# Patient Record
Sex: Female | Born: 1965
Health system: Southern US, Community
[De-identification: ages and names within clinical notes are randomized; demographics above are authoritative.]

## PROBLEM LIST (undated history)

## (undated) DIAGNOSIS — D649 Anemia, unspecified: Secondary | ICD-10-CM

## (undated) DIAGNOSIS — R51 Headache: Secondary | ICD-10-CM

## (undated) DIAGNOSIS — M797 Fibromyalgia: Secondary | ICD-10-CM

## (undated) DIAGNOSIS — F329 Major depressive disorder, single episode, unspecified: Secondary | ICD-10-CM

## (undated) DIAGNOSIS — F32A Depression, unspecified: Secondary | ICD-10-CM

## (undated) DIAGNOSIS — F419 Anxiety disorder, unspecified: Secondary | ICD-10-CM

## (undated) DIAGNOSIS — Z860100 Personal history of colon polyps, unspecified: Secondary | ICD-10-CM

## (undated) DIAGNOSIS — T7840XA Allergy, unspecified, initial encounter: Secondary | ICD-10-CM

## (undated) DIAGNOSIS — E079 Disorder of thyroid, unspecified: Secondary | ICD-10-CM

## (undated) DIAGNOSIS — G4700 Insomnia, unspecified: Secondary | ICD-10-CM

## (undated) DIAGNOSIS — R569 Unspecified convulsions: Secondary | ICD-10-CM

## (undated) HISTORY — PX: ABDOMINAL HYSTERECTOMY: SHX81

## (undated) HISTORY — DX: Fibromyalgia: M79.7

## (undated) HISTORY — DX: Headache: R51

## (undated) HISTORY — DX: Anxiety disorder, unspecified: F41.9

## (undated) HISTORY — DX: Insomnia, unspecified: G47.00

## (undated) HISTORY — DX: Personal history of colon polyps, unspecified: Z86.0100

## (undated) HISTORY — DX: Allergy, unspecified, initial encounter: T78.40XA

## (undated) HISTORY — DX: Unspecified convulsions: R56.9

## (undated) HISTORY — DX: Major depressive disorder, single episode, unspecified: F32.9

## (undated) HISTORY — DX: Disorder of thyroid, unspecified: E07.9

## (undated) HISTORY — DX: Anemia, unspecified: D64.9

## (undated) HISTORY — PX: KNEE ARTHROSCOPY: SUR90

## (undated) HISTORY — DX: Personal history of colonic polyps: Z86.010

## (undated) HISTORY — DX: Depression, unspecified: F32.A

---

## 2001-02-05 ENCOUNTER — Other Ambulatory Visit: Admission: RE | Admit: 2001-02-05 | Discharge: 2001-02-05 | Payer: Self-pay | Admitting: Obstetrics and Gynecology

## 2001-02-08 ENCOUNTER — Ambulatory Visit (HOSPITAL_COMMUNITY): Admission: RE | Admit: 2001-02-08 | Discharge: 2001-02-08 | Payer: Self-pay | Admitting: Obstetrics and Gynecology

## 2001-05-21 ENCOUNTER — Inpatient Hospital Stay (HOSPITAL_COMMUNITY): Admission: AD | Admit: 2001-05-21 | Discharge: 2001-05-23 | Payer: Self-pay | Admitting: Obstetrics and Gynecology

## 2002-06-07 ENCOUNTER — Emergency Department (HOSPITAL_COMMUNITY): Admission: EM | Admit: 2002-06-07 | Discharge: 2002-06-07 | Payer: Self-pay | Admitting: Emergency Medicine

## 2002-06-07 ENCOUNTER — Encounter: Payer: Self-pay | Admitting: Emergency Medicine

## 2004-04-06 ENCOUNTER — Ambulatory Visit (HOSPITAL_COMMUNITY): Admission: RE | Admit: 2004-04-06 | Discharge: 2004-04-06 | Payer: Self-pay | Admitting: Family Medicine

## 2004-06-30 ENCOUNTER — Ambulatory Visit (HOSPITAL_COMMUNITY): Admission: RE | Admit: 2004-06-30 | Discharge: 2004-06-30 | Payer: Self-pay | Admitting: Family Medicine

## 2004-10-26 ENCOUNTER — Encounter: Admission: RE | Admit: 2004-10-26 | Discharge: 2004-10-26 | Payer: Self-pay | Admitting: Orthopedic Surgery

## 2005-09-13 ENCOUNTER — Encounter: Admission: RE | Admit: 2005-09-13 | Discharge: 2005-09-13 | Payer: Self-pay | Admitting: Neurological Surgery

## 2007-02-25 ENCOUNTER — Encounter: Admission: RE | Admit: 2007-02-25 | Discharge: 2007-02-25 | Payer: Self-pay | Admitting: Neurology

## 2008-02-05 ENCOUNTER — Encounter: Admission: RE | Admit: 2008-02-05 | Discharge: 2008-02-05 | Payer: Self-pay | Admitting: Orthopedic Surgery

## 2009-08-17 ENCOUNTER — Ambulatory Visit (HOSPITAL_COMMUNITY): Admission: RE | Admit: 2009-08-17 | Discharge: 2009-08-17 | Payer: Self-pay | Admitting: Family Medicine

## 2010-11-13 ENCOUNTER — Ambulatory Visit (HOSPITAL_COMMUNITY)
Admission: RE | Admit: 2010-11-13 | Discharge: 2010-11-13 | Disposition: A | Discharge status: Home / Self Care | Payer: Self-pay | Source: Ambulatory Visit | Attending: Family Medicine | Admitting: Family Medicine

## 2010-11-13 ENCOUNTER — Other Ambulatory Visit: Payer: Self-pay | Admitting: Family Medicine

## 2010-11-13 DIAGNOSIS — X58XXXA Exposure to other specified factors, initial encounter: Secondary | ICD-10-CM | POA: Insufficient documentation

## 2010-11-13 DIAGNOSIS — S0003XA Contusion of scalp, initial encounter: Secondary | ICD-10-CM | POA: Insufficient documentation

## 2010-11-13 DIAGNOSIS — J3489 Other specified disorders of nose and nasal sinuses: Secondary | ICD-10-CM | POA: Insufficient documentation

## 2010-11-13 DIAGNOSIS — R569 Unspecified convulsions: Secondary | ICD-10-CM | POA: Insufficient documentation

## 2010-11-13 DIAGNOSIS — R51 Headache: Secondary | ICD-10-CM | POA: Insufficient documentation

## 2010-11-26 NOTE — Discharge Summary (Signed)
[facility]  Patient:    Courtney Green, Courtney Green Visit Number: [id number] MRN: [id number]          Service Type: SUR Location: 4A A418 01 Attending Physician:  Tilda Burrow Dictated by:   Christin Bach, M.D. Admit Date:  05/21/2001 Discharge Date: 05/23/2001                             Discharge Summary  ADMISSION DIAGNOSES: 1. Chronic pelvic pain. 2. Dysmenorrhea.  DISCHARGE DIAGNOSES: 1. Chronic pelvic pain. 2. Dysmenorrhea.  PROCEDURES:  Laparoscopic-assisted vaginal hysterectomy with bilateral salpingo-oophorectomy.  DISCHARGE MEDICATIONS: 1. Tylox 1 q.4h. p.r.n. pain, dispense 15 tablets. 2. Motrin 200 mg 2 q.4h. p.r.n. mild pain. 3. Doxycycline 100 mg twice daily for one week. 4. Climara 0.1 mg patch to skin weekly.  FOLLOWUP:  In one week in my office.  HISTORY OF PRESENT ILLNESS:  This 45 year old female, with a longstanding history of chronic pelvic pain and dysmenorrhea, status post diagnostic laparoscopy which did not reveal any readily correctable etiology, was admitted for an abdominal hysterectomy after failed medical therapy.  The patient wanted removal of the ovaries after discussion.  The patient has had debilitating adnexal pain.  She has been fully informed of potential for osteoporosis and long-term ______ on hormone replacement therapy.  She understands that estrogen therapy of some sort is appropriate at this time and osteoporosis therapy long term requirement.  PAST MEDICAL HISTORY:  Positive for cigarette use.  PAST SURGICAL HISTORY:  Diagnostic laparoscopy in 2002, tubal ligation in 1993, arthroscopy x 2 in 1987 and 1990.  ALLERGIES:  CODEINE.  MEDICATIONS:  None.  SOCIAL HISTORY:  Cigarettes: Greater than one pack per day.  PHYSICAL EXAMINATION:  VITAL SIGNS:  Height 5 feet 5 inches, weight 160.  Blood pressure 112/80, pulse 84, respirations 16.  HEENT:  Pupils equal, round, and reactive.  Extraocular  movements intact.  NECK:  Supple.  Trachea midline.  CHEST:  Clear to auscultation.  ABDOMEN:  Nontender.  PELVIC:  Vaginal exam shows first degree uterine decensus with uterus severely sensitive to contact, leaving patient crampy for hours after exam or intercourse.  Adnexa without masses.  Left lower quadrant 2+ tenderness upon palpation of the abdomen.  HOSPITAL COURSE:  The patient was admitted, underwent laparoscopic-assisted vaginal hysterectomy with 200 cc blood loss.  Postoperatively, the patient had a hemoglobin of 11, hematocrit 32, with nontender abdomen.  Bowel sounds returned nicely.  The patient had passage of gas and resumption of tolerance of a regular diet and was discharged home on postop day #2 for followup in one week in our office. Dictated by:   Christin Bach, M.D. Attending Physician:  Tilda Burrow DD:  06/14/01 TD:  06/14/01 Job: [id number]

## 2010-11-26 NOTE — Op Note (Signed)
[facility]  Patient:    Courtney Green, Courtney Green Visit Number: [id number] MRN: [id number]          Service Type: SUR Location: 4A A418 01 Attending Physician:  Tilda Burrow Dictated by:   Christin Bach, M.D. Proc. Date: 05/21/01 Admit Date:  05/21/2001 Discharge Date: 05/23/2001                             Operative Report  PREOPERATIVE DIAGNOSIS:  Chronic pelvic pain, rule out adenomyosis.  POSTOPERATIVE DIAGNOSIS:  Chronic pelvic pain, rule out adenomyosis.  OPERATION: 1. Laparoscopic-assisted vaginal hysterectomy. 2. Bilateral salpingo-oophorectomy.  SURGEON:  Christin Bach, M.D.  ASSISTANT:  None.  ANESTHESIA:  General.  COMPLICATIONS:  Need to delay procedure after laparoscopic observation portion of the procedure completed due to emergency cesarean section elsewhere.  DETAILS OF PROCEDURE:  The patient was taken to the operating room and prepped and draped in the usual fashion for combined abdominal and vaginal procedure with Foley catheter in place, abdomen and perineum prepped and draped, and the legs supported in low lithotomy yellow fin supports.  The Foley catheter was in place as mentioned, and the anterior incision was made on the anterior cervicovaginal fornix, the bladder elevated up.  A gauze was placed in the dissected cleavage plane which had been anesthetized with Xylocaine with epinephrine which resulted in improved hemostasis.  Attention was then directed to the abdomen where subumbilical vertical 1 cm skin incision was made as well as a transverse suprapubic 1 cm incision.  The Veress needle was introduced into the umbilicus, and 4 mmHg intra-abdominal pressure confirmed.  The procedure then had the 10 mm umbilical port inserted without difficulty and the suprapubic port inserted under direct visualization.  At this time, it became necessary to delay proceeding any further due to an emergency in labor and delivery  which required brief assessment before deciding for emergency cesarean which was handled elsewhere by Dr. Lisette Grinder and dictated by him.  Once the baby was delivered and the uterine incision closed, we then proceeded once again to start with the LAVH.  The abdomen was re-insufflated, the suprapubic trocars used to manipulate bowel which was found to be hemostatic. The lower quadrant lateral incisions were made to allow introduction of an 11 mm multiport on each side, with attention then directed to the left adnexa where the round ligament was easily visible.  The left adnexa was without adhesions, and the thickened capsule mentioned at last exam was the only notable finding.  As per patient request, we planned to remove both tubes and ovaries bilaterally.  First, the ureters were identified beneath the retroperitoneal surfaces and were confirmed to be well out of harms way.  The triangle behind the round ligament was then opened, and the loose areolar connective tissue dissected free.  The uterus was brought for plans to remove the three major samples.  We then proceeded to take the round ligaments down bilaterally and used retroperitoneal dissection to isolate the infundibulopelvic ligament on each side.  The infundibulopelvic ligament was carefully isolated and crossclamped with Endo GIA 3.5 staples which were easily fired and achieved hemostasis. The adnexal structures were skeletonized down to the uterine vessels.  The bladder flap was opened anteriorly down to the loose gauze placed per vagina earlier in the case.  Vaginal hysterectomy then proceeded in the standard fashion after repositioning myself to the end of the table, sitting down, placing the lap  tray on the abdomen, and beginning the hysterectomy.  The posterior colpotomy incision was performed, and then the uterosacral ligaments were taken down on each side.  We then proceeded to move up the lateral cardinal ligaments  on each side, clamping, cutting, and suture ligating in series.  There was approximately 200 cc of bleeding which was mainly related to the cul-de-sac edge which bled more generously.  Once the procedure was completed, we then proceeded to place a 2-0 silk suture between the uterosacral ligaments on either side, pulling them together in the midline, and then the cuff was closed using pursestring suture of 2-0 chromic.  The cuff was closed by pulling the uterosacral ligaments together in the midline, tying them together, tying the silk suture in the midline as well, and then midline closure of the vaginal cuff with good tissue support results.  The patient tolerated the procedure well and went to the recovery room in good condition. ictated by:   Christin Bach, M.D. Attending Physician:  Tilda Burrow DD:  05/21/01 TD:  05/21/01 Job: [id number]

## 2011-06-10 ENCOUNTER — Ambulatory Visit
Admission: RE | Admit: 2011-06-10 | Discharge: 2011-06-10 | Disposition: A | Discharge status: Home / Self Care | Payer: No Typology Code available for payment source | Source: Ambulatory Visit | Attending: Infectious Diseases | Admitting: Infectious Diseases

## 2011-06-10 ENCOUNTER — Other Ambulatory Visit: Payer: Self-pay | Admitting: Infectious Diseases

## 2011-06-10 DIAGNOSIS — R7611 Nonspecific reaction to tuberculin skin test without active tuberculosis: Secondary | ICD-10-CM

## 2011-10-31 ENCOUNTER — Other Ambulatory Visit: Payer: Self-pay | Admitting: Family Medicine

## 2011-11-01 ENCOUNTER — Other Ambulatory Visit: Payer: Self-pay

## 2011-11-02 ENCOUNTER — Telehealth: Payer: Self-pay

## 2011-11-02 ENCOUNTER — Other Ambulatory Visit: Payer: Self-pay | Admitting: Family Medicine

## 2011-11-02 MED ORDER — ALPRAZOLAM 0.25 MG PO TABS: 0.2500 mg | ORAL_TABLET | Freq: Three times a day (TID) | ORAL | Status: DC

## 2011-11-02 NOTE — Telephone Encounter (Signed)
I need this chart and then have the appropriate provider address

## 2011-11-02 NOTE — Telephone Encounter (Signed)
Pt called back and wanted to ask Dr L if he could RF her Xanax w/out an OV right now. She had one RF left but date on RFs had expired. She got a job at [employer] and her ins starts in June, but if she comes in for visit now, she is afraid it will be listed as a preexisting condition and will "mess up her ins". She is caring for a family member w/cancer right now and needs to get her xanax RFd but would like to wait for OV until her ins starts.

## 2011-11-02 NOTE — Telephone Encounter (Signed)
Dr.Lauenstein: Pt would like to have a refill on her xanax, pt states that she has not been to our office in over a year but because of insurance issues.

## 2011-11-02 NOTE — Telephone Encounter (Signed)
Can you please advise on this?  Chart is in your box

## 2011-11-02 NOTE — Telephone Encounter (Signed)
Dr Milus Glazier wrote handwritten script for xanax 0.25 one tab TID #90 plus 1 RF for pt. Called in Rx to [facility] Notified pt. I entered Rx into EPIC as a "phone in". Dr L, I am sending this back to you bc I had to enter it as a verbal w/readback order for you to sign.

## 2011-11-30 ENCOUNTER — Ambulatory Visit: Payer: Self-pay | Admitting: *Deleted

## 2012-01-21 ENCOUNTER — Ambulatory Visit (INDEPENDENT_AMBULATORY_CARE_PROVIDER_SITE_OTHER): Payer: 59 | Admitting: Family Medicine

## 2012-01-21 VITALS — BP 132/90 | HR 66 | Temp 97.8°F | Resp 16 | Ht 64.0 in | Wt 128.0 lb

## 2012-01-21 DIAGNOSIS — F419 Anxiety disorder, unspecified: Secondary | ICD-10-CM

## 2012-01-21 DIAGNOSIS — G4700 Insomnia, unspecified: Secondary | ICD-10-CM

## 2012-01-21 DIAGNOSIS — F411 Generalized anxiety disorder: Secondary | ICD-10-CM

## 2012-01-21 MED ORDER — ZOLPIDEM TARTRATE 10 MG PO TABS: ORAL_TABLET | ORAL | Status: DC

## 2012-01-21 MED ORDER — ALPRAZOLAM 0.25 MG PO TABS: 0.2500 mg | ORAL_TABLET | Freq: Three times a day (TID) | ORAL | Status: DC | PRN

## 2012-01-21 NOTE — Progress Notes (Signed)
  [facility]:  Office Visit  Chief Complaint:  Chief Complaint  Patient presents with  . Medication Refill    xanax  . Insomnia    pt needs something to help her sleep     HPI: Courtney Green is a 46 y.o. female who complains of  Med refills: Anxiety-xanax, ambien NO SI/HI. HAs been on meds before without SEs.   Past Medical History  Diagnosis Date  . Anxiety    No past surgical history on file. History   Social History  . Marital Status: Legally Separated    Spouse Name: N/A    Number of Children: N/A  . Years of Education: N/A   Social History Main Topics  . Smoking status: Current Everyday Smoker  . Smokeless tobacco: None  . Alcohol Use: Yes  . Drug Use: No  . Sexually Active: None   Other Topics Concern  . None   Social History Narrative  . None   No family history on file. Allergies  Allergen Reactions  . Morphine And Related Itching   Prior to Admission medications   Medication Sig Start Date End Date Taking? Authorizing Provider  ALPRAZolam (XANAX) 0.25 MG tablet Take 1 tablet (0.25 mg total) by mouth 3 (three) times daily. 11/02/11  Yes Elvina Sidle, MD  levothyroxine (SYNTHROID, LEVOTHROID) 112 MCG tablet Take 112 mcg by mouth daily.   Yes Historical Provider, MD  levothyroxine (SYNTHROID, LEVOTHROID) 125 MCG tablet Take 125 mcg by mouth daily.   Yes Historical Provider, MD     ROS: The patient denies fevers, chills, night sweats, unintentional weight loss, chest pain, palpitations, wheezing, dyspnea on exertion, nausea, vomiting, abdominal pain, dysuria, hematuria, melena, numbness, weakness, or tingling.   All other systems have been reviewed and were otherwise negative with the exception of those mentioned in the HPI and as above.    PHYSICAL EXAM: Filed Vitals:   01/21/12 0839  BP: 132/90  Pulse: 66  Temp: 97.8 F (36.6 C)  Resp: 16   Filed Vitals:   01/21/12 0839  Height: 5\' 4"  (1.626 m)  Weight: 128 lb  (58.06 kg)   Body mass index is 21.97 kg/(m^2).  General: Alert, no acute distress HEENT:  Normocephalic, atraumatic, oropharynx patent.  Cardiovascular:  Regular rate and rhythm, no rubs murmurs or gallops.  No Carotid bruits, radial pulse intact. No pedal edema.  Respiratory: Clear to auscultation bilaterally.  No wheezes, rales, or rhonchi.  No cyanosis, no use of accessory musculature GI: No organomegaly, abdomen is soft and non-tender, positive bowel sounds.  No masses. Skin: No rashes. Neurologic: Facial musculature symmetric. Psychiatric: Patient is appropriate throughout our interaction. Lymphatic: No cervical lymphadenopathy Musculoskeletal: Gait intact.   LABS: No results found for this or any previous visit.   EKG/XRAY:   Primary read interpreted by Dr. Conley Rolls at [facility].   ASSESSMENT/PLAN: Encounter Diagnoses  Name Primary?  Marland Kitchen Anxiety Yes  . 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 She denies any SI/HI. She will see Dr. Milus Glazier in next 3 months for appt for refill before last refill runs out from today's visit.  Refill Ambien #30, 2 refills Refill Xanax #90, 2 refill   Courtney Green PHUONG, DO 01/23/2012 7:54 AM

## 2012-01-23 ENCOUNTER — Encounter: Payer: Self-pay | Admitting: Family Medicine

## 2012-03-01 ENCOUNTER — Other Ambulatory Visit (HOSPITAL_COMMUNITY): Payer: Self-pay | Admitting: Endocrinology

## 2012-03-01 DIAGNOSIS — E049 Nontoxic goiter, unspecified: Secondary | ICD-10-CM

## 2012-03-05 ENCOUNTER — Other Ambulatory Visit (HOSPITAL_COMMUNITY): Payer: Self-pay

## 2012-03-15 ENCOUNTER — Inpatient Hospital Stay (HOSPITAL_COMMUNITY): Admission: RE | Admit: 2012-03-15 | Payer: Self-pay | Source: Ambulatory Visit

## 2012-03-22 ENCOUNTER — Ambulatory Visit: Payer: 59 | Admitting: Family Medicine

## 2012-04-03 ENCOUNTER — Other Ambulatory Visit: Payer: Self-pay | Admitting: Family Medicine

## 2012-04-03 DIAGNOSIS — G4700 Insomnia, unspecified: Secondary | ICD-10-CM

## 2012-04-03 DIAGNOSIS — F419 Anxiety disorder, unspecified: Secondary | ICD-10-CM

## 2012-04-03 MED ORDER — ALPRAZOLAM 0.25 MG PO TABS: 0.2500 mg | ORAL_TABLET | Freq: Three times a day (TID) | ORAL | Status: DC | PRN

## 2012-04-03 MED ORDER — ZOLPIDEM TARTRATE 10 MG PO TABS: ORAL_TABLET | ORAL | Status: DC

## 2012-04-05 ENCOUNTER — Encounter: Payer: Self-pay | Admitting: *Deleted

## 2012-04-05 DIAGNOSIS — R51 Headache: Secondary | ICD-10-CM

## 2012-04-05 DIAGNOSIS — R519 Headache, unspecified: Secondary | ICD-10-CM | POA: Insufficient documentation

## 2012-07-11 HISTORY — PX: COLONOSCOPY: SHX174

## 2012-08-08 ENCOUNTER — Other Ambulatory Visit: Payer: Self-pay | Admitting: Family Medicine

## 2012-08-08 DIAGNOSIS — F419 Anxiety disorder, unspecified: Secondary | ICD-10-CM

## 2012-08-08 DIAGNOSIS — G4700 Insomnia, unspecified: Secondary | ICD-10-CM

## 2012-08-08 MED ORDER — ALPRAZOLAM 0.25 MG PO TABS: 0.2500 mg | ORAL_TABLET | Freq: Three times a day (TID) | ORAL | Status: DC | PRN

## 2012-10-09 ENCOUNTER — Other Ambulatory Visit: Payer: Self-pay | Admitting: Family Medicine

## 2012-10-09 NOTE — Telephone Encounter (Signed)
Checked with pt on plans for OV. She stated that she has been w/out a job, but just started one this month. It will be 90 days before she will have insurance. Pt stated when she had spoken with Dr L in Dec or Jan about the RFs of Alprazolam being expired, he was going to change it at the pharmacy, but pharmacy did not have a record of the change. Pt requests RFs of Alprazolam until she can get in after ins begins.

## 2012-10-11 NOTE — Telephone Encounter (Signed)
Notified pt that she needed an OV before more RFs could be sent. Pt stated she will have to be w/out medication until she can get her insurance.

## 2012-10-14 ENCOUNTER — Ambulatory Visit (INDEPENDENT_AMBULATORY_CARE_PROVIDER_SITE_OTHER): Payer: Self-pay | Admitting: Family Medicine

## 2012-10-14 VITALS — BP 111/77 | HR 84 | Temp 98.1°F | Resp 16 | Ht 63.0 in | Wt 124.0 lb

## 2012-10-14 DIAGNOSIS — F411 Generalized anxiety disorder: Secondary | ICD-10-CM

## 2012-10-14 DIAGNOSIS — F409 Phobic anxiety disorder, unspecified: Secondary | ICD-10-CM

## 2012-10-14 DIAGNOSIS — G4700 Insomnia, unspecified: Secondary | ICD-10-CM

## 2012-10-14 DIAGNOSIS — L723 Sebaceous cyst: Secondary | ICD-10-CM

## 2012-10-14 MED ORDER — ALPRAZOLAM ER 1 MG PO TB24: 1.0000 mg | ORAL_TABLET | ORAL | Status: DC

## 2012-10-14 MED ORDER — TRETINOIN 0.025 % EX CREA: TOPICAL_CREAM | Freq: Every day | CUTANEOUS | Status: DC

## 2012-10-14 MED ORDER — TRAZODONE HCL 100 MG PO TABS: 100.0000 mg | ORAL_TABLET | Freq: Every day | ORAL | Status: DC

## 2012-10-14 NOTE — Progress Notes (Signed)
  Subjective:    Patient ID: Courtney Green, female    DOB: [birth year], 47 y.o.   MRN: [id number]  HPI  Patient presents today with a history of a seizure that has since effected her speech. She experiences much anxiety around this issue.   The xanax that she was taking at night has become more frequent. She has to take it 3-4 times a day for her to feel relief. She is wondering if there is something she can take that might work better or an extended release. She does not have any ideas as to what she would like to try.   She was not able to fill the Ambien due to the cost. She is still not able to get to sleep at night. She has been experiencing frequent restlessness and wakes up often. She never feels rested when waking up in the morning. She feels like she wants to sleep everyday, all day.    Patient has no insurance.  Working for [employer].    Hashimoto disease was diagnosed last year and she states that her thyroid was shut down completely.     Review of Systems     Objective:   Physical Exam  Patient appears alert, NAD, but discouraged.  Left upper chest 1 cm rough erythematous area with central blackened pore      Assessment & Plan:  Needs psychiatric care but cannot afford it Insomnia - Plan: traZODone (DESYREL) 100 MG tablet  Sebaceous cyst - Plan: tretinoin (RETIN-A) 0.025 % cream  Generalized anxiety disorder - Plan: ALPRAZolam (XANAX XR) 1 MG 24 hr tablet

## 2012-10-14 NOTE — Patient Instructions (Addendum)
[facility] [address] [facility] offers counseling

## 2013-05-15 ENCOUNTER — Other Ambulatory Visit: Payer: Self-pay | Admitting: Family Medicine

## 2013-10-17 ENCOUNTER — Other Ambulatory Visit: Payer: Self-pay | Admitting: Endocrinology

## 2013-10-17 DIAGNOSIS — R1310 Dysphagia, unspecified: Secondary | ICD-10-CM

## 2013-10-18 ENCOUNTER — Ambulatory Visit
Admission: RE | Admit: 2013-10-18 | Discharge: 2013-10-18 | Disposition: A | Discharge status: Home / Self Care | Payer: BC Managed Care – PPO | Source: Ambulatory Visit | Attending: Endocrinology | Admitting: Endocrinology

## 2013-10-18 DIAGNOSIS — R1310 Dysphagia, unspecified: Secondary | ICD-10-CM

## 2013-10-31 ENCOUNTER — Encounter (HOSPITAL_COMMUNITY): Payer: Self-pay | Admitting: Emergency Medicine

## 2013-10-31 ENCOUNTER — Emergency Department (HOSPITAL_COMMUNITY)
Admission: EM | Admit: 2013-10-31 | Discharge: 2013-11-01 | Discharge status: Against Medical Advice | Payer: BC Managed Care – PPO | Attending: Emergency Medicine | Admitting: Emergency Medicine

## 2013-10-31 ENCOUNTER — Emergency Department (HOSPITAL_COMMUNITY): Payer: BC Managed Care – PPO

## 2013-10-31 DIAGNOSIS — R0789 Other chest pain: Secondary | ICD-10-CM | POA: Insufficient documentation

## 2013-10-31 LAB — CBC WITH DIFFERENTIAL/PLATELET
BASOS ABS: 0 10*3/uL (ref 0.0–0.1)
Basophils Relative: 0 % (ref 0–1)
EOS PCT: 2 % (ref 0–5)
Eosinophils Absolute: 0.2 10*3/uL (ref 0.0–0.7)
HCT: 41.9 % (ref 36.0–46.0)
Hemoglobin: 14.9 g/dL (ref 12.0–15.0)
LYMPHS PCT: 27 % (ref 12–46)
Lymphs Abs: 2.8 10*3/uL (ref 0.7–4.0)
MCH: 35.2 pg — ABNORMAL HIGH (ref 26.0–34.0)
MCHC: 35.6 g/dL (ref 30.0–36.0)
MCV: 99.1 fL (ref 78.0–100.0)
Monocytes Absolute: 0.6 10*3/uL (ref 0.1–1.0)
Monocytes Relative: 6 % (ref 3–12)
NEUTROS PCT: 65 % (ref 43–77)
Neutro Abs: 6.8 10*3/uL (ref 1.7–7.7)
Platelets: 257 10*3/uL (ref 150–400)
RBC: 4.23 MIL/uL (ref 3.87–5.11)
RDW: 14.9 % (ref 11.5–15.5)
WBC: 10.4 10*3/uL (ref 4.0–10.5)

## 2013-10-31 LAB — COMPREHENSIVE METABOLIC PANEL
ALK PHOS: 54 U/L (ref 39–117)
ALT: 8 U/L (ref 0–35)
AST: 16 U/L (ref 0–37)
Albumin: 4.1 g/dL (ref 3.5–5.2)
BUN: 13 mg/dL (ref 6–23)
CALCIUM: 9.3 mg/dL (ref 8.4–10.5)
CO2: 24 mEq/L (ref 19–32)
Chloride: 105 mEq/L (ref 96–112)
Creatinine, Ser: 0.63 mg/dL (ref 0.50–1.10)
GFR calc Af Amer: >90 mL/min (ref >90)
Glucose, Bld: 108 mg/dL — ABNORMAL HIGH (ref 70–99)
Potassium: 3.3 mEq/L — ABNORMAL LOW (ref 3.7–5.3)
SODIUM: 141 meq/L (ref 137–147)
Total Bilirubin: 0.5 mg/dL (ref 0.3–1.2)
Total Protein: 7 g/dL (ref 6.0–8.3)

## 2013-10-31 LAB — I-STAT TROPONIN, ED: Troponin i, poc: 0 ng/mL (ref 0.00–0.08)

## 2013-10-31 NOTE — ED Notes (Signed)
Called patient for vital reassessment with no response

## 2013-10-31 NOTE — ED Notes (Signed)
Pt states that she was laying in bed after not going to work due to diarrhea last night. Pt states 2 hours ago her chest starting hurting with radiation down her left arm and leg. Pt also felt SOB. Pt took 1 SL nitro with no relief. Pt also took a 1 mg of xanax with no relief from pain. Pt alert and oriented.

## 2013-11-01 NOTE — ED Notes (Signed)
Called to be roomed, and she did not answer.  The patient was called three times.

## 2013-11-01 NOTE — ED Notes (Signed)
The patient was called on three separate occassions and she did not answer.

## 2013-12-18 ENCOUNTER — Other Ambulatory Visit: Payer: Self-pay | Admitting: Family Medicine

## 2014-07-11 DIAGNOSIS — G35 Multiple sclerosis: Secondary | ICD-10-CM

## 2014-07-11 HISTORY — DX: Multiple sclerosis: G35

## 2015-08-06 ENCOUNTER — Emergency Department (HOSPITAL_COMMUNITY): Payer: Managed Care, Other (non HMO)

## 2015-08-06 ENCOUNTER — Emergency Department (HOSPITAL_COMMUNITY)
Admission: EM | Admit: 2015-08-06 | Discharge: 2015-08-06 | Disposition: A | Discharge status: Home / Self Care | Payer: Managed Care, Other (non HMO) | Attending: Emergency Medicine | Admitting: Emergency Medicine

## 2015-08-06 ENCOUNTER — Encounter (HOSPITAL_COMMUNITY): Payer: Self-pay | Admitting: *Deleted

## 2015-08-06 DIAGNOSIS — F419 Anxiety disorder, unspecified: Secondary | ICD-10-CM | POA: Diagnosis not present

## 2015-08-06 DIAGNOSIS — E079 Disorder of thyroid, unspecified: Secondary | ICD-10-CM | POA: Diagnosis not present

## 2015-08-06 DIAGNOSIS — Z7951 Long term (current) use of inhaled steroids: Secondary | ICD-10-CM | POA: Diagnosis not present

## 2015-08-06 DIAGNOSIS — F1110 Opioid abuse, uncomplicated: Secondary | ICD-10-CM | POA: Diagnosis not present

## 2015-08-06 DIAGNOSIS — R531 Weakness: Secondary | ICD-10-CM | POA: Diagnosis not present

## 2015-08-06 DIAGNOSIS — F1310 Sedative, hypnotic or anxiolytic abuse, uncomplicated: Secondary | ICD-10-CM | POA: Insufficient documentation

## 2015-08-06 DIAGNOSIS — R29898 Other symptoms and signs involving the musculoskeletal system: Secondary | ICD-10-CM

## 2015-08-06 DIAGNOSIS — F444 Conversion disorder with motor symptom or deficit: Secondary | ICD-10-CM | POA: Diagnosis not present

## 2015-08-06 DIAGNOSIS — F329 Major depressive disorder, single episode, unspecified: Secondary | ICD-10-CM | POA: Insufficient documentation

## 2015-08-06 DIAGNOSIS — F17210 Nicotine dependence, cigarettes, uncomplicated: Secondary | ICD-10-CM | POA: Insufficient documentation

## 2015-08-06 DIAGNOSIS — R51 Headache: Secondary | ICD-10-CM | POA: Diagnosis not present

## 2015-08-06 DIAGNOSIS — Z79899 Other long term (current) drug therapy: Secondary | ICD-10-CM | POA: Diagnosis not present

## 2015-08-06 DIAGNOSIS — G8250 Quadriplegia, unspecified: Secondary | ICD-10-CM

## 2015-08-06 LAB — I-STAT CHEM 8, ED
BUN: 11 mg/dL (ref 6–20)
CREATININE: 0.6 mg/dL (ref 0.44–1.00)
Calcium, Ion: 1.07 mmol/L — ABNORMAL LOW (ref 1.12–1.23)
Chloride: 104 mmol/L (ref 101–111)
GLUCOSE: 100 mg/dL — AB (ref 65–99)
HEMATOCRIT: 41 % (ref 36.0–46.0)
Hemoglobin: 13.9 g/dL (ref 12.0–15.0)
Potassium: 3.3 mmol/L — ABNORMAL LOW (ref 3.5–5.1)
Sodium: 139 mmol/L (ref 135–145)
TCO2: 24 mmol/L (ref 0–100)

## 2015-08-06 LAB — CBG MONITORING, ED: GLUCOSE-CAPILLARY: 84 mg/dL (ref 65–99)

## 2015-08-06 LAB — CBC
HCT: 39.3 % (ref 36.0–46.0)
HEMOGLOBIN: 13.4 g/dL (ref 12.0–15.0)
MCH: 32.2 pg (ref 26.0–34.0)
MCHC: 34.1 g/dL (ref 30.0–36.0)
MCV: 94.5 fL (ref 78.0–100.0)
Platelets: 223 10*3/uL (ref 150–400)
RBC: 4.16 MIL/uL (ref 3.87–5.11)
RDW: 14.2 % (ref 11.5–15.5)
WBC: 8.5 10*3/uL (ref 4.0–10.5)

## 2015-08-06 LAB — RAPID URINE DRUG SCREEN, HOSP PERFORMED
Amphetamines: NOT DETECTED
BARBITURATES: NOT DETECTED
BENZODIAZEPINES: POSITIVE — AB
Cocaine: NOT DETECTED
Opiates: POSITIVE — AB
TETRAHYDROCANNABINOL: NOT DETECTED

## 2015-08-06 LAB — COMPREHENSIVE METABOLIC PANEL
ALBUMIN: 3.9 g/dL (ref 3.5–5.0)
ALK PHOS: 55 U/L (ref 38–126)
ALT: 21 U/L (ref 14–54)
ANION GAP: 13 (ref 5–15)
AST: 23 U/L (ref 15–41)
BILIRUBIN TOTAL: 0.5 mg/dL (ref 0.3–1.2)
BUN: 9 mg/dL (ref 6–20)
CALCIUM: 9.2 mg/dL (ref 8.9–10.3)
CO2: 24 mmol/L (ref 22–32)
Chloride: 102 mmol/L (ref 101–111)
Creatinine, Ser: 0.69 mg/dL (ref 0.44–1.00)
Glucose, Bld: 98 mg/dL (ref 65–99)
Potassium: 3.6 mmol/L (ref 3.5–5.1)
Sodium: 139 mmol/L (ref 135–145)
TOTAL PROTEIN: 6.5 g/dL (ref 6.5–8.1)

## 2015-08-06 LAB — URINALYSIS, ROUTINE W REFLEX MICROSCOPIC
Bilirubin Urine: NEGATIVE
GLUCOSE, UA: NEGATIVE mg/dL
HGB URINE DIPSTICK: NEGATIVE
KETONES UR: NEGATIVE mg/dL
Leukocytes, UA: NEGATIVE
Nitrite: NEGATIVE
PROTEIN: NEGATIVE mg/dL
Specific Gravity, Urine: 1.008 (ref 1.005–1.030)
pH: 7.5 (ref 5.0–8.0)

## 2015-08-06 LAB — DIFFERENTIAL
Basophils Absolute: 0 10*3/uL (ref 0.0–0.1)
Basophils Relative: 0 %
EOS ABS: 0.3 10*3/uL (ref 0.0–0.7)
EOS PCT: 4 %
LYMPHS ABS: 2.2 10*3/uL (ref 0.7–4.0)
LYMPHS PCT: 26 %
MONO ABS: 0.6 10*3/uL (ref 0.1–1.0)
MONOS PCT: 7 %
Neutro Abs: 5.3 10*3/uL (ref 1.7–7.7)
Neutrophils Relative %: 63 %

## 2015-08-06 LAB — PROTIME-INR
INR: 0.94 (ref 0.00–1.49)
Prothrombin Time: 12.8 seconds (ref 11.6–15.2)

## 2015-08-06 LAB — I-STAT TROPONIN, ED: TROPONIN I, POC: 0 ng/mL (ref 0.00–0.08)

## 2015-08-06 LAB — ETHANOL: Alcohol, Ethyl (B): <5 mg/dL (ref <5)

## 2015-08-06 LAB — APTT: aPTT: 27 seconds (ref 24–37)

## 2015-08-06 MED ORDER — LORAZEPAM 2 MG/ML IJ SOLN
0.5000 mg | Freq: Once | INTRAMUSCULAR | Status: DC
  Filled 2015-08-06: qty 1

## 2015-08-06 MED ORDER — GADOBENATE DIMEGLUMINE 529 MG/ML IV SOLN
10.0000 mL | Freq: Once | INTRAVENOUS | Status: AC
  Administered 2015-08-06: 10 mL via INTRAVENOUS

## 2015-08-06 MED ORDER — LORAZEPAM 2 MG/ML IJ SOLN
1.0000 mg | Freq: Once | INTRAMUSCULAR | Status: AC
  Administered 2015-08-06: 1 mg via INTRAVENOUS

## 2015-08-06 NOTE — ED Notes (Signed)
Pt in MRI.

## 2015-08-06 NOTE — ED Notes (Signed)
Called MRI staff, Pt remains in MRI, pt to be returned to room upon completion of MRI

## 2015-08-06 NOTE — Discharge Instructions (Signed)
It is unclear why you had diffuse weakness today. There is no stroke on your MRI. There are some nonspecific "spots" on there that you need to get check out by a neurologist. If your symptoms recur, call 911 or return to the ER

## 2015-08-06 NOTE — Progress Notes (Signed)
50 year old presents to [facility] as code stroke from [facility].  Patient reports all was well this AM when she awakened - she drove to work and started to feel "wierd" on way to work - thought her Xanax was affecting her more than normal- she got to work - felt weak - went to restroom then to desk where she became weak all over and could not stand.  EMS was called - they were unable to see her move arms or legs - no other sx - code stroke called in the field.  On arrival she is alert - calm - warm and dry - not moving arms or legs - she has muscle tone in all 4 extremities - on exam she does not hold arms up - when assessed arms fall to bed but in different areas - does not allow arm to hit face - NIHHS 12 for arms and legs.  No other neuro deficits noted.  No blood thinners at home.  Patient reports hx of one time seizure activity - no treatment.  Dr. Silverio Decamp at bedside - no acute treatment at this time.  Order for MRI head and spine.  Handoff to [employer] - to call as needed.

## 2015-08-06 NOTE — ED Provider Notes (Signed)
CSN: [id number]     Arrival date & time 08/06/15  [id number] History   First MD Initiated Contact with Patient 08/06/15 [phone]     Chief Complaint  Patient presents with  . Code Stroke     (Consider location/radiation/quality/duration/timing/severity/associated sxs/prior Treatment) HPI  50 year old female presents as a code stroke. States her symptoms started around 6:30 when she was driving to work. Patient states she woke up before this feeling normal. On driving to work she stated she started to feel "funny". Thought her xanax was kicking in more than usual. Has then progressed to weakness in all 4 extremities. EMS noted some hypertension with a systolic blood pressure of 180. They report all 4 extremity is flaccid. Patient has not had any trouble breathing. Is starting to develop a mild headache. No chest pain or dyspnea. Feels like all 4 extremities "are like jello". No neck pain  Past Medical History  Diagnosis Date  . Anxiety   . Headache(784.0)   . Depression   . Seizures ([place])   . Thyroid disease   . Allergy    Past Surgical History  Procedure Laterality Date  . Abdominal hysterectomy     No family history on file. Social History  Substance Use Topics  . Smoking status: Current Every Day Smoker -- 0.20 packs/day    Types: Cigarettes  . Smokeless tobacco: None  . Alcohol Use: Yes     Comment: occassional   OB History    No data available     Review of Systems  Respiratory: Negative for shortness of breath.   Cardiovascular: Negative for chest pain.  Gastrointestinal: Negative for vomiting.  Musculoskeletal: Negative for neck pain.  Neurological: Positive for weakness and headaches. Negative for numbness.  All other systems reviewed and are negative.     Allergies  Aspirin and Morphine and related  Home Medications   Prior to Admission medications   Medication Sig Start Date End Date Taking? Authorizing Provider  ALPRAZolam (XANAX XR) 1 MG 24 hr tablet Take 1  tablet (1 mg total) by mouth every morning. 10/14/12  Yes Robyn Haber, MD  benzonatate (TESSALON) 100 MG capsule Take 100 mg by mouth every evening. cough 06/16/15  Yes Historical Provider, MD  DULoxetine (CYMBALTA) 30 MG capsule Take 60 mg by mouth at bedtime. 07/24/15  Yes Historical Provider, MD  fluticasone (FLONASE) 50 MCG/ACT nasal spray Place 1 spray into both nostrils daily. 07/29/15  Yes Historical Provider, MD  SYNTHROID 100 MCG tablet Take 100 mcg by mouth daily. 07/24/15  Yes Historical Provider, MD  traZODone (DESYREL) 100 MG tablet Take 1 tablet (100 mg total) by mouth at bedtime. PATIENT NEEDS OFFICE VISIT FOR ADDITIONAL REFILLS Patient not taking: Reported on 08/06/2015 05/15/13   Robyn Haber, MD  tretinoin (RETIN-A) 0.025 % cream Apply topically at bedtime. Patient not taking: Reported on 08/06/2015 10/14/12   Robyn Haber, MD  zolpidem (AMBIEN) 10 MG tablet Take 1 tab PO qhs prn insomnia Patient not taking: Reported on 08/06/2015 04/03/12   Robyn Haber, MD   BP 144/94 mmHg  Pulse 74  Temp(Src) 98.1 F (36.7 C) (Oral)  Resp 18  Ht 5\' 4"  (1.626 m)  Wt 151 lb (68.493 kg)  BMI 25.91 kg/m2  SpO2 100% Physical Exam  Constitutional: She is oriented to person, place, and time. She appears well-developed and well-nourished.  Resting comfortably on stretcher. No distress and does not appear significantly concerned about not being able to move any of her extremities  HENT:  Head: Normocephalic and atraumatic.  Right Ear: External ear normal.  Left Ear: External ear normal.  Nose: Nose normal.  Eyes: EOM are normal. Pupils are equal, round, and reactive to light. Right eye exhibits no discharge. Left eye exhibits no discharge.  Cardiovascular: Normal rate, regular rhythm and normal heart sounds.   Pulses:      Radial pulses are 2+ on the right side, and 2+ on the left side.       Dorsalis pedis pulses are 2+ on the right side, and 2+ on the left side.  Pulmonary/Chest:  Effort normal and breath sounds normal.  Abdominal: Soft. There is no tenderness.  Neurological: She is alert and oriented to person, place, and time.  Reflex Scores:      Bicep reflexes are 2+ on the right side and 2+ on the left side.      Patellar reflexes are 2+ on the right side and 2+ on the left side. Patient is unable to move any extremities on command except slight twitches with grip. When neuro was doing their exam, she avoids her arm being dropped on her face and moves it to the side. Extremities do not seem to fall straight down to stretcher. States that her sensation is intact  Skin: Skin is warm and dry.  All 4 extremities are warm and well perfused  Nursing note and vitals reviewed.   ED Course  Procedures (including critical care time) Labs Review Labs Reviewed  URINE RAPID DRUG SCREEN, HOSP PERFORMED - Abnormal; Notable for the following:    Opiates POSITIVE (*)    Benzodiazepines POSITIVE (*)    All other components within normal limits  I-STAT CHEM 8, ED - Abnormal; Notable for the following:    Potassium 3.3 (*)    Glucose, Bld 100 (*)    Calcium, Ion 1.07 (*)    All other components within normal limits  PROTIME-INR  APTT  CBC  DIFFERENTIAL  COMPREHENSIVE METABOLIC PANEL  ETHANOL  URINALYSIS, ROUTINE W REFLEX MICROSCOPIC (NOT AT [facility])  I-STAT TROPOININ, ED  CBG MONITORING, ED    Imaging Review Ct Head Wo Contrast  08/06/2015  CLINICAL DATA:  BILATERAL arm weakness and headache over RIGHT eye, code stroke, smoker EXAM: CT HEAD WITHOUT CONTRAST TECHNIQUE: Contiguous axial images were obtained from the base of the skull through the vertex without intravenous contrast. COMPARISON:  11/13/2010 FINDINGS: Normal ventricular morphology. No midline shift or mass effect. Question minimal small vessel chronic ischemic changes is deep cerebral white matter. No intracranial hemorrhage, mass lesion, or evidence acute infarction. No extra-axial fluid collections. Mucosal  retention cyst RIGHT maxillary sinus. Scattered mucosal thickening in the maxillary sinuses with minimal RIGHT maxillary sinus fluid. Bones unremarkable. IMPRESSION: No acute intracranial abnormalities. Findings called to Dr. Silverio Decamp on 08/06/2015 at 0814 hours. Electronically Signed   By: Lavonia Dana M.D.   On: 08/06/2015 08:15   Mr Jeri Cos [id number] Contrast  08/06/2015  CLINICAL DATA:  Acute onset of left arm weakness and inability to move the lower extremities upon waking this morning. Possible multiple sclerosis. EXAM: MRI HEAD WITHOUT AND WITH CONTRAST MRI CERVICAL SPINE WITHOUT AND WITH CONTRAST TECHNIQUE: Multiplanar, multiecho pulse sequences of the brain and surrounding structures, and cervical spine, to include the craniocervical junction and cervicothoracic junction, were obtained without and with intravenous contrast. CONTRAST:  78mL MULTIHANCE GADOBENATE DIMEGLUMINE 529 MG/ML IV SOLN COMPARISON:  Head CT 08/06/2015. Head and cervical spine MRI 02/25/2007. FINDINGS: MRI HEAD FINDINGS There is no evidence  of acute infarct, intracranial hemorrhage, mass, midline shift, or extra-axial fluid collection. Two subcentimeter lesions in the subcortical white matter of the left frontoparietal region are unchanged. There are also 3 subcentimeter lesions in the predominantly subcortical right frontal white matter which were not clearly present on the prior study, although direct comparison is limited by differences in imaging technique with increased sensitivity for lesion detection on the current study. The corpus callosum, brainstem, and cerebellum are unremarkable. No abnormal enhancement is identified. Orbits are unremarkable. Right greater than left maxillary sinus mucosal thickening, a right maxillary sinus mucous retention cyst, and moderate right ethmoid air cell mucosal thickening are noted. There is a trace left mastoid effusion. Major intracranial vascular flow voids are preserved. MRI CERVICAL SPINE  FINDINGS The study is mildly to moderately motion degraded. There is slight reversal of the normal cervical lordosis. Trace retrolisthesis is noted of C4 on C5, stable to minimally more prominent than on the prior study. Vertebral body heights are preserved. Mild disc space narrowing is present at C4-5. No vertebral marrow edema is seen. The craniocervical junction is unremarkable. The cervical spinal cord is normal in caliber without signal abnormality identified within limitations of motion artifact. No abnormal enhancement is identified. Paraspinal soft tissues are unremarkable. C2-3:  Negative. C3-4: At most a tiny central disc protrusion without stenosis, unchanged. C4-5: Broad-based posterior disc osteophyte complex results in new, mild spinal stenosis. No significant mass effect on the spinal cord or neural foraminal stenosis. C5-6: Mild disc bulging and right uncovertebral spurring result in new, mild right neural foraminal stenosis and borderline spinal stenosis without mass effect on the spinal cord. C6-7: Broad-based posterior disc osteophyte complex results in borderline to mild spinal stenosis and mild right and minimal left neural foraminal stenosis, new/progressed from prior. C7-T1:  Negative. IMPRESSION: 1. No acute intracranial abnormality. 2. Several small foci of cerebral white matter T2 signal abnormality, mildly increased from the 2008 MRI and nonspecific. No lesions strongly suggestive of demyelinating disease identified. No abnormal enhancement. 3. Normal appearance of the cervical spinal cord within limitations of motion artifact. 4. Mildly progressive cervical disc degeneration with borderline to mild spinal stenosis from C4-5 to C6-7. Electronically Signed   By: Logan Bores M.D.   On: 08/06/2015 12:25   Mr Cervical Spine W Wo Contrast  08/06/2015  CLINICAL DATA:  Acute onset of left arm weakness and inability to move the lower extremities upon waking this morning. Possible multiple  sclerosis. EXAM: MRI HEAD WITHOUT AND WITH CONTRAST MRI CERVICAL SPINE WITHOUT AND WITH CONTRAST TECHNIQUE: Multiplanar, multiecho pulse sequences of the brain and surrounding structures, and cervical spine, to include the craniocervical junction and cervicothoracic junction, were obtained without and with intravenous contrast. CONTRAST:  45mL MULTIHANCE GADOBENATE DIMEGLUMINE 529 MG/ML IV SOLN COMPARISON:  Head CT 08/06/2015. Head and cervical spine MRI 02/25/2007. FINDINGS: MRI HEAD FINDINGS There is no evidence of acute infarct, intracranial hemorrhage, mass, midline shift, or extra-axial fluid collection. Two subcentimeter lesions in the subcortical white matter of the left frontoparietal region are unchanged. There are also 3 subcentimeter lesions in the predominantly subcortical right frontal white matter which were not clearly present on the prior study, although direct comparison is limited by differences in imaging technique with increased sensitivity for lesion detection on the current study. The corpus callosum, brainstem, and cerebellum are unremarkable. No abnormal enhancement is identified. Orbits are unremarkable. Right greater than left maxillary sinus mucosal thickening, a right maxillary sinus mucous retention cyst, and moderate right ethmoid  air cell mucosal thickening are noted. There is a trace left mastoid effusion. Major intracranial vascular flow voids are preserved. MRI CERVICAL SPINE FINDINGS The study is mildly to moderately motion degraded. There is slight reversal of the normal cervical lordosis. Trace retrolisthesis is noted of C4 on C5, stable to minimally more prominent than on the prior study. Vertebral body heights are preserved. Mild disc space narrowing is present at C4-5. No vertebral marrow edema is seen. The craniocervical junction is unremarkable. The cervical spinal cord is normal in caliber without signal abnormality identified within limitations of motion artifact. No  abnormal enhancement is identified. Paraspinal soft tissues are unremarkable. C2-3:  Negative. C3-4: At most a tiny central disc protrusion without stenosis, unchanged. C4-5: Broad-based posterior disc osteophyte complex results in new, mild spinal stenosis. No significant mass effect on the spinal cord or neural foraminal stenosis. C5-6: Mild disc bulging and right uncovertebral spurring result in new, mild right neural foraminal stenosis and borderline spinal stenosis without mass effect on the spinal cord. C6-7: Broad-based posterior disc osteophyte complex results in borderline to mild spinal stenosis and mild right and minimal left neural foraminal stenosis, new/progressed from prior. C7-T1:  Negative. IMPRESSION: 1. No acute intracranial abnormality. 2. Several small foci of cerebral white matter T2 signal abnormality, mildly increased from the 2008 MRI and nonspecific. No lesions strongly suggestive of demyelinating disease identified. No abnormal enhancement. 3. Normal appearance of the cervical spinal cord within limitations of motion artifact. 4. Mildly progressive cervical disc degeneration with borderline to mild spinal stenosis from C4-5 to C6-7. Electronically Signed   By: Logan Bores M.D.   On: 08/06/2015 12:25   I have personally reviewed and evaluated these images and lab results as part of my medical decision-making.   EKG Interpretation   Date/Time:  Thursday August 06 2015 08:32:48 EST Ventricular Rate:  63 PR Interval:  161 QRS Duration: 100 QT Interval:  415 QTC Calculation: 425 R Axis:   73 Text Interpretation:  Sinus rhythm Minimal ST depression, inferior leads  No significant change since last tracing Confirmed by Loy Mccartt  MD, Jaxn Chiquito  ([id number]) on 08/06/2015 8:34:44 AM      MDM   Final diagnoses:  Upper extremity weakness  Weakness of both lower extremities    Patient with a nonphysiologic neuro exam. After MRI, patient's strength has completely returned and she is  now able to ambulate. She states that her strength returned immediately after getting out of MRI. Most likely cause is psychogenic. However she is not acutely psychotic. She has some nonspecific white matter changes in her MRI, neurology feels these are nonsignificant and not significantly changed from prior. Recommend outpatient neuro follow-up.    Sherwood Gambler, MD 08/06/15 [phone]

## 2015-08-06 NOTE — Consult Note (Signed)
Requesting Physician: Dr.  Regenia Skeeter    Reason for consultation: Code stroke  HPI:                                                                                                                                         Courtney Green is an 50 y.o. female patient who presented  [facility] ED as code stroke from [facility]. Patient reports all was well this AM when she awakened - she drove to work and started to feel "wierd" on way to work - thought her Xanax was affecting her more than normal- she got to work - felt weak - went to restroom then to desk where she became weak all over and could not stand. EMS was called - they were unable to see her move arms or legs - no other sx - code stroke called in the field.   Date last known well:  08/06/15 Time last known well:  6.30 AM tPA Given: No: Non focal exam. Not an acute stroke  Stroke Risk Factors - none  Past Medical History: Past Medical History  Diagnosis Date  . Anxiety   . Headache(784.0)   . Depression   . Seizures   . Thyroid disease   . Allergy     Past Surgical History  Procedure Laterality Date  . Abdominal hysterectomy      Family History: No family history on file.  Social History:   reports that she has been smoking.  She does not have any smokeless tobacco history on file. She reports that she drinks alcohol. She reports that she does not use illicit drugs.  Allergies:  Allergies  Allergen Reactions  . Aspirin Nausea Only  . Morphine And Related Itching     Medications:                                                                                                                        No current facility-administered medications for this encounter.  Current outpatient prescriptions:  .  ALPRAZolam (XANAX XR) 1 MG 24 hr tablet, Take 1 tablet (1 mg total) by mouth every morning., Disp: 30 tablet, Rfl: 3 .  levothyroxine (SYNTHROID, LEVOTHROID) 112 MCG tablet, Take 112 mcg by mouth daily., Disp: , Rfl:  .   levothyroxine (SYNTHROID, LEVOTHROID) 125 MCG tablet, Take 125 mcg by mouth daily., Disp: , Rfl:  .  traZODone (DESYREL) 100 MG tablet, Take 1 tablet (100 mg total) by mouth at bedtime. PATIENT NEEDS OFFICE VISIT FOR ADDITIONAL REFILLS, Disp: 30 tablet, Rfl: 0 .  tretinoin (RETIN-A) 0.025 % cream, Apply topically at bedtime., Disp: 45 g, Rfl: 0 .  zolpidem (AMBIEN) 10 MG tablet, Take 1 tab PO qhs prn insomnia, Disp: 90 tablet, Rfl: 1   ROS:                                                                                                                                       History obtained from the patient  General ROS: negative for - chills, fatigue, fever, night sweats, weight gain or weight loss Psychological ROS: negative for - behavioral disorder, hallucinations, memory difficulties, mood swings or suicidal ideation Ophthalmic ROS: negative for - blurry vision, double vision, eye pain or loss of vision ENT ROS: negative for - epistaxis, nasal discharge, oral lesions, sore throat, tinnitus or vertigo Allergy and Immunology ROS: negative for - hives or itchy/watery eyes Hematological and Lymphatic ROS: negative for - bleeding problems, bruising or swollen lymph nodes Endocrine ROS: negative for - galactorrhea, hair pattern changes, polydipsia/polyuria or temperature intolerance Respiratory ROS: negative for - cough, hemoptysis, shortness of breath or wheezing Cardiovascular ROS: negative for - chest pain, dyspnea on exertion, edema or irregular heartbeat Gastrointestinal ROS: negative for - abdominal pain, diarrhea, hematemesis, nausea/vomiting or stool incontinence Genito-Urinary ROS: negative for - dysuria, hematuria, incontinence or urinary frequency/urgency Musculoskeletal ROS: negative for - joint swelling or muscular weakness Neurological ROS: as noted in HPI Dermatological ROS: negative for rash and skin lesion changes  Neurologic Examination:                                                                                                       There were no vitals taken for this visit.  Evaluation of higher integrative functions including: Level of alertness: Alert,  Oriented to time, place and person Speech: fluent, no evidence of dysarthria or aphasia noted.  Test the following cranial nerves: 2-12 grossly intact Motor examination: Normal tone, initially declined to move any extremities on command except slight twitches with grip.  she avoids her arm being dropped on her face and moves it to the side. Extremities do not seem to fall straight down to stretcher.,- psychogenic motor exam Examination of sensation :reports symmetric sensation to pinprick in all 4 extremities and on face Examination of deep tendon reflexes: 2+, normal and symmetric in all extremities,  normal plantars bilaterally Test coordination: Normal finger nose testing, with no evidence of limb appendicular ataxia or abnormal involuntary movements or tremors noted.  Gait: Deferred   Lab Results: Basic Metabolic Panel:  Recent Labs Lab 08/06/15 0808  NA 139  K 3.3*  CL 104  GLUCOSE 100*  BUN 11  CREATININE 0.60    Liver Function Tests: No results for input(s): AST, ALT, ALKPHOS, BILITOT, PROT, ALBUMIN in the last 168 hours. No results for input(s): LIPASE, AMYLASE in the last 168 hours. No results for input(s): AMMONIA in the last 168 hours.  CBC:  Recent Labs Lab 08/06/15 0808  HGB 13.9  HCT 41.0    Cardiac Enzymes: No results for input(s): CKTOTAL, CKMB, CKMBINDEX, TROPONINI in the last 168 hours.  Lipid Panel: No results for input(s): CHOL, TRIG, HDL, CHOLHDL, VLDL, LDLCALC in the last 168 hours.  CBG: No results for input(s): GLUCAP in the last 168 hours.  Microbiology: No results found for this or any previous visit.   Imaging: Ct Head Wo Contrast  08/06/2015  CLINICAL DATA:  BILATERAL arm weakness and headache over RIGHT eye, code stroke, smoker EXAM: CT HEAD WITHOUT  CONTRAST TECHNIQUE: Contiguous axial images were obtained from the base of the skull through the vertex without intravenous contrast. COMPARISON:  11/13/2010 FINDINGS: Normal ventricular morphology. No midline shift or mass effect. Question minimal small vessel chronic ischemic changes is deep cerebral white matter. No intracranial hemorrhage, mass lesion, or evidence acute infarction. No extra-axial fluid collections. Mucosal retention cyst RIGHT maxillary sinus. Scattered mucosal thickening in the maxillary sinuses with minimal RIGHT maxillary sinus fluid. Bones unremarkable. IMPRESSION: No acute intracranial abnormalities. Findings called to Dr. Silverio Decamp on 08/06/2015 at 0814 hours. Electronically Signed   By: Lavonia Dana M.D.   On: 08/06/2015 08:15    Assessment and plan:   Courtney Green is an 50 y.o. female patient who presented subjective sx of generalized weakness, with psychogenic motor exam. Non localizing sx. Unlikley to be an acute stroke. Nevertheless, recommend MRI brain adn cervical spine to rule out acute pathology responsible for her presentation of weakness in all 4 extremities.   MRI studies were negative for any acute pathology.   Her presentation is s/o conversion disorder.   Patient's weakness has resolved following the MRI's and she walked out of the ER. Discharged home.  Recommend to f/u with out patient neurology.

## 2015-08-06 NOTE — ED Notes (Signed)
Pt's mother at bedside.

## 2015-08-06 NOTE — ED Notes (Signed)
Pt in from work via [place], per report pt was LSN @ 6:50 this morning with bil extremity weakness, pt states, "I was at work & I couldn't stand." pt reported to be slumped over her work desk, pt A&O x4, no facial droop present,  Pt follows commands, speaks in complete sentences

## 2015-09-16 ENCOUNTER — Other Ambulatory Visit: Payer: Self-pay | Admitting: Internal Medicine

## 2015-09-16 DIAGNOSIS — E063 Autoimmune thyroiditis: Secondary | ICD-10-CM

## 2015-10-08 ENCOUNTER — Other Ambulatory Visit: Payer: Managed Care, Other (non HMO)

## 2015-10-21 ENCOUNTER — Other Ambulatory Visit: Payer: Managed Care, Other (non HMO)

## 2016-05-29 ENCOUNTER — Emergency Department (HOSPITAL_COMMUNITY)
Admission: EM | Admit: 2016-05-29 | Discharge: 2016-05-29 | Disposition: A | Discharge status: Home / Self Care | Payer: Managed Care, Other (non HMO) | Attending: Emergency Medicine | Admitting: Emergency Medicine

## 2016-05-29 ENCOUNTER — Encounter (HOSPITAL_COMMUNITY): Payer: Self-pay

## 2016-05-29 DIAGNOSIS — Z5321 Procedure and treatment not carried out due to patient leaving prior to being seen by health care provider: Secondary | ICD-10-CM | POA: Insufficient documentation

## 2016-05-29 DIAGNOSIS — F17210 Nicotine dependence, cigarettes, uncomplicated: Secondary | ICD-10-CM | POA: Diagnosis not present

## 2016-05-29 DIAGNOSIS — R109 Unspecified abdominal pain: Secondary | ICD-10-CM | POA: Diagnosis not present

## 2016-05-29 NOTE — ED Notes (Signed)
Pt cannot use restroom at this time, aware urine specimen is needed.  

## 2016-05-29 NOTE — ED Notes (Signed)
She has just informed me she is leaving. She ambulates capably with her cane and leaves our department with her mother. I apologize profusely and offer to see if a provider can see her now and she declines. She also declines to sign AMA.

## 2016-05-29 NOTE — ED Notes (Signed)
Bed: WA14 Expected date:  Expected time:  Means of arrival:  Comments: EMS-abdominal pain 

## 2016-05-29 NOTE — ED Triage Notes (Signed)
She c/o sudden right flank pain which occurred at ~0630 today. She states it was severe, but is better now.

## 2016-06-17 ENCOUNTER — Other Ambulatory Visit: Payer: Self-pay | Admitting: Endocrinology

## 2016-06-17 DIAGNOSIS — E049 Nontoxic goiter, unspecified: Secondary | ICD-10-CM

## 2016-06-21 ENCOUNTER — Ambulatory Visit
Admission: RE | Admit: 2016-06-21 | Discharge: 2016-06-21 | Disposition: A | Discharge status: Home / Self Care | Payer: Managed Care, Other (non HMO) | Source: Ambulatory Visit | Attending: Endocrinology | Admitting: Endocrinology

## 2016-06-21 DIAGNOSIS — E049 Nontoxic goiter, unspecified: Secondary | ICD-10-CM

## 2016-11-11 ENCOUNTER — Ambulatory Visit: Payer: Managed Care, Other (non HMO) | Admitting: Internal Medicine

## 2016-11-16 ENCOUNTER — Encounter: Payer: Self-pay | Admitting: Internal Medicine

## 2016-11-16 ENCOUNTER — Ambulatory Visit (INDEPENDENT_AMBULATORY_CARE_PROVIDER_SITE_OTHER): Payer: Managed Care, Other (non HMO) | Admitting: Internal Medicine

## 2016-11-16 ENCOUNTER — Encounter (INDEPENDENT_AMBULATORY_CARE_PROVIDER_SITE_OTHER): Payer: Self-pay

## 2016-11-16 VITALS — BP 112/80 | HR 58 | Ht 63.5 in | Wt 116.0 lb

## 2016-11-16 DIAGNOSIS — W57XXXA Bitten or stung by nonvenomous insect and other nonvenomous arthropods, initial encounter: Secondary | ICD-10-CM | POA: Diagnosis not present

## 2016-11-16 DIAGNOSIS — R51 Headache: Secondary | ICD-10-CM | POA: Diagnosis not present

## 2016-11-16 DIAGNOSIS — F32A Depression, unspecified: Secondary | ICD-10-CM

## 2016-11-16 DIAGNOSIS — G35 Multiple sclerosis: Secondary | ICD-10-CM | POA: Insufficient documentation

## 2016-11-16 DIAGNOSIS — F5104 Psychophysiologic insomnia: Secondary | ICD-10-CM

## 2016-11-16 DIAGNOSIS — E039 Hypothyroidism, unspecified: Secondary | ICD-10-CM

## 2016-11-16 DIAGNOSIS — G40909 Epilepsy, unspecified, not intractable, without status epilepticus: Secondary | ICD-10-CM | POA: Insufficient documentation

## 2016-11-16 DIAGNOSIS — S30861A Insect bite (nonvenomous) of abdominal wall, initial encounter: Secondary | ICD-10-CM | POA: Diagnosis not present

## 2016-11-16 DIAGNOSIS — F419 Anxiety disorder, unspecified: Secondary | ICD-10-CM

## 2016-11-16 DIAGNOSIS — R519 Headache, unspecified: Secondary | ICD-10-CM | POA: Insufficient documentation

## 2016-11-16 DIAGNOSIS — J301 Allergic rhinitis due to pollen: Secondary | ICD-10-CM | POA: Diagnosis not present

## 2016-11-16 DIAGNOSIS — M797 Fibromyalgia: Secondary | ICD-10-CM | POA: Diagnosis not present

## 2016-11-16 DIAGNOSIS — F329 Major depressive disorder, single episode, unspecified: Secondary | ICD-10-CM | POA: Diagnosis not present

## 2016-11-16 DIAGNOSIS — R569 Unspecified convulsions: Secondary | ICD-10-CM | POA: Diagnosis not present

## 2016-11-16 DIAGNOSIS — G4700 Insomnia, unspecified: Secondary | ICD-10-CM | POA: Insufficient documentation

## 2016-11-16 MED ORDER — ESZOPICLONE 2 MG PO TABS: 2.0000 mg | ORAL_TABLET | Freq: Every evening | ORAL | 0 refills | Status: DC | PRN

## 2016-11-16 MED ORDER — FLUTICASONE PROPIONATE 50 MCG/ACT NA SUSP: 1.0000 | Freq: Every day | NASAL | 1 refills | Status: DC

## 2016-11-16 NOTE — Assessment & Plan Note (Signed)
Will try Lunesta again RX provided to patient

## 2016-11-16 NOTE — Assessment & Plan Note (Signed)
TSH, T3 and T4 today Will adjust Synthroid if needed based on labs

## 2016-11-16 NOTE — Assessment & Plan Note (Signed)
Will need notes from neuro She is currently not seeking treatment for this

## 2016-11-16 NOTE — Assessment & Plan Note (Signed)
She does not follow with neurology Continue Keppra for now Will monitor LFT's Will need to see neurology at some point

## 2016-11-16 NOTE — Assessment & Plan Note (Signed)
Continue Claritin and Flonase Flonase refilled today

## 2016-11-16 NOTE — Assessment & Plan Note (Signed)
Secondary to seizures [place]k to continue Advil prn

## 2016-11-16 NOTE — Assessment & Plan Note (Signed)
Chronic She is no longer taking Xanax- advised her I would not fill this Will give her Clonazepam to take daily, but will obtain CSA and UDS before prescribing If deteriorates, may need psychiatry referral

## 2016-11-16 NOTE — Patient Instructions (Signed)
Insomnia Insomnia is a sleep disorder that makes it difficult to fall asleep or to stay asleep. Insomnia can cause tiredness (fatigue), low energy, difficulty concentrating, mood swings, and poor performance at work or school. There are three different ways to classify insomnia:  Difficulty falling asleep.  Difficulty staying asleep.  Waking up too early in the morning. Any type of insomnia can be long-term (chronic) or short-term (acute). Both are common. Short-term insomnia usually lasts for three months or less. Chronic insomnia occurs at least three times a week for longer than three months. What are the causes? Insomnia may be caused by another condition, situation, or substance, such as:  Anxiety.  Certain medicines.  Gastroesophageal reflux disease (GERD) or other gastrointestinal conditions.  Asthma or other breathing conditions.  Restless legs syndrome, sleep apnea, or other sleep disorders.  Chronic pain.  Menopause. This may include hot flashes.  Stroke.  Abuse of alcohol, tobacco, or illegal drugs.  Depression.  Caffeine.  Neurological disorders, such as Alzheimer disease.  An overactive thyroid (hyperthyroidism). The cause of insomnia may not be known. What increases the risk? Risk factors for insomnia include:  Gender. Women are more commonly affected than men.  Age. Insomnia is more common as you get older.  Stress. This may involve your professional or personal life.  Income. Insomnia is more common in people with lower income.  Lack of exercise.  Irregular work schedule or night shifts.  Traveling between different time zones. What are the signs or symptoms? If you have insomnia, trouble falling asleep or trouble staying asleep is the main symptom. This may lead to other symptoms, such as:  Feeling fatigued.  Feeling nervous about going to sleep.  Not feeling rested in the morning.  Having trouble concentrating.  Feeling irritable,  anxious, or depressed. How is this treated? Treatment for insomnia depends on the cause. If your insomnia is caused by an underlying condition, treatment will focus on addressing the condition. Treatment may also include:  Medicines to help you sleep.  Counseling or therapy.  Lifestyle adjustments. Follow these instructions at home:  Take medicines only as directed by your health care provider.  Keep regular sleeping and waking hours. Avoid naps.  Keep a sleep diary to help you and your health care provider figure out what could be causing your insomnia. Include:  When you sleep.  When you wake up during the night.  How well you sleep.  How rested you feel the next day.  Any side effects of medicines you are taking.  What you eat and drink.  Make your bedroom a comfortable place where it is easy to fall asleep:  Put up shades or special blackout curtains to block light from outside.  Use a white noise machine to block noise.  Keep the temperature cool.  Exercise regularly as directed by your health care provider. Avoid exercising right before bedtime.  Use relaxation techniques to manage stress. Ask your health care provider to suggest some techniques that may work well for you. These may include:  Breathing exercises.  Routines to release muscle tension.  Visualizing peaceful scenes.  Cut back on alcohol, caffeinated beverages, and cigarettes, especially close to bedtime. These can disrupt your sleep.  Do not overeat or eat spicy foods right before bedtime. This can lead to digestive discomfort that can make it hard for you to sleep.  Limit screen use before bedtime. This includes:  Watching TV.  Using your smartphone, tablet, and computer.  Stick to a   routine. This can help you fall asleep faster. Try to do a quiet activity, brush your teeth, and go to bed at the same time each night.  Get out of bed if you are still awake after 15 minutes of trying to  sleep. Keep the lights down, but try reading or doing a quiet activity. When you feel sleepy, go back to bed.  Make sure that you drive carefully. Avoid driving if you feel very sleepy.  Keep all follow-up appointments as directed by your health care provider. This is important. Contact a health care provider if:  You are tired throughout the day or have trouble in your daily routine due to sleepiness.  You continue to have sleep problems or your sleep problems get worse. Get help right away if:  You have serious thoughts about hurting yourself or someone else. This information is not intended to replace advice given to you by your health care provider. Make sure you discuss any questions you have with your health care provider. Document Released: 06/24/2000 Document Revised: 11/27/2015 Document Reviewed: 03/28/2014 Elsevier Interactive Patient Education  2017 Elsevier Inc.  

## 2016-11-16 NOTE — Progress Notes (Signed)
Pre visit review using our clinic review tool, if applicable. No additional management support is needed unless otherwise documented below in the visit note. 

## 2016-11-16 NOTE — Assessment & Plan Note (Signed)
Doing okay on Cymbalta Will monitor

## 2016-11-16 NOTE — Progress Notes (Signed)
HPI  Pt presents to the clinic today to establish care and for management of the conditions. She is transferring care from Lutricia Feil, MD.  Seasonal Allergies: Worse during the spring. She takes Claritin and Flonase as needed with good relief.  Anxiety and Depression: Recently triggered by death of her fiancee. He committed suicide. She was taking Alprazolam ER every morning, but reports she has weaned herself off of this. She takes Clonazepam every night. She denies SI/HI. She does not follow with psychiatry.  Fibromyalgia/MS: She reports she "fired" her neurologist in January. She takes Cymbalta but is not taking anything for the MS. She does have intermittent muscle weakness, balance problems and muscle cramping.  Insomnia: She has trouble falling asleep. She takes Clonazepam at night, which helps her stay asleep but does not help her fall asleep. She has tried Ambien, Lunesta in the past but after some time, she reports it wasn't as effective as it used to be.  Frequent Headaches: These occur daily. They are located in the back of her head (area of previous head trauma). The pain radiates into her neck. She is taking Keppra daily for headaches. She reports she takes Advil as needed with some relief.  Seizures: She reports she has only had 1 Grand Mal seizure. She reports she has not had any seizures since starting the [facility].  Hypothyroidism: She has noticed that her hair has been falling out. Her levels were last checked 6 months ago. She is taking Synthroid as prescribed.  She also reports she pulled 2 ticks off her about 2 weeks ago. She denies fever, rash, nausea, diarrhea, joint pain or confusion. The area where the ticks were are still itchy. She has not tried anything OTC for this.  Flu: 04/2016 Tetanus: 2012 Pap Smear: Hysterectomy, total Mammogram: > 5 years Colon Screening: 2016, polyps (3 years) Vision Screening: as needed Dentist: annually  Past Medical History:   Diagnosis Date  . Allergy   . Anxiety   . Depression   . Fibromyalgia   . Headache(784.0)   . Insomnia   . Seizures ([place])   . Thyroid disease   . Thyroid disease     Current Outpatient Prescriptions  Medication Sig Dispense Refill  . ALPRAZolam (XANAX XR) 1 MG 24 hr tablet Take 1 tablet (1 mg total) by mouth every morning. 30 tablet 3  . benzonatate (TESSALON) 100 MG capsule Take 100 mg by mouth every evening. cough  1  . clonazePAM (KLONOPIN) 0.5 MG tablet Take 0.5 mg by mouth at bedtime.    . DULoxetine (CYMBALTA) 30 MG capsule Take 60 mg by mouth at bedtime.  5  . fluticasone (FLONASE) 50 MCG/ACT nasal spray Place 1 spray into both nostrils daily.  1  . levETIRAcetam (KEPPRA) 500 MG tablet Take 500 mg by mouth Nightly.    Marland Kitchen SYNTHROID 100 MCG tablet Take 100 mcg by mouth daily.  5   No current facility-administered medications for this visit.     Allergies  Allergen Reactions  . Aspirin Nausea Only  . Morphine And Related Itching    No family history on file.  Social History   Social History  . Marital status: Divorced    Spouse name: N/A  . Number of children: N/A  . Years of education: N/A   Occupational History  . Not on file.   Social History Main Topics  . Smoking status: Former Smoker    Packs/day: 0.20    Types: Cigarettes  Quit date: 07/11/2013  . Smokeless tobacco: Never Used  . Alcohol use Yes     Comment: occassional  . Drug use: No  . Sexual activity: Not on file   Other Topics Concern  . Not on file   Social History Narrative  . No narrative on file    ROS:  Constitutional: Pt reports fatigue and headaches. Denies fever, malaise, or abrupt weight changes.  HEENT: Pt reports nasal congestion. Denies eye pain, eye redness, ear pain, ringing in the ears, wax buildup, runny nose, bloody nose, or sore throat. Respiratory: Denies difficulty breathing, shortness of breath, cough or sputum production.   Cardiovascular: Denies chest pain,  chest tightness, palpitations or swelling in the hands or feet.  Gastrointestinal: Denies abdominal pain, bloating, constipation, diarrhea or blood in the stool.  GU: Denies frequency, urgency, pain with urination, blood in urine, odor or discharge. Musculoskeletal: Pt reports muscle pain and weakness. Denies decrease in range of motion, difficulty with gait, or joint pain and swelling.  Skin: Pt reports tick bites of abdomen. Denies rashes, lesions or ulcercations.  Neurological: Denies dizziness, difficulty with memory, difficulty with speech or problems with balance and coordination.  Psych: Pt reports history of anxiety and depression. Denies SI/HI.  No other specific complaints in a complete review of systems (except as listed in HPI above).  PE:  BP 112/80   Pulse (!) 58   Ht 5' 3.5" (1.613 m)   Wt 116 lb (52.6 kg)   SpO2 99%   BMI 20.23 kg/m  Wt Readings from Last 3 Encounters:  11/16/16 116 lb (52.6 kg)  08/06/15 151 lb (68.5 kg)  10/31/13 121 lb 8 oz (55.1 kg)    General: Appears her stated age, well developed, well nourished in NAD. HEENT: Throat/Mouth: Teeth present, mucosa pink and moist, no lesions or ulcerations noted.  Neck: Neck supple, trachea midline. No masses, lumps present.  Cardiovascular: Normal rate and rhythm. S1,S2 noted.  No murmur, rubs or gallops noted.  Pulmonary/Chest: Normal effort and positive vesicular breath sounds. No respiratory distress. No wheezes, rales or ronchi noted.  Musculoskeletal: No difficulty with gait.  Neurological: Alert and oriented.  Psychiatric: Mood and affect normal. Behavior is normal. Judgment and thought content normal.  Skin: 2 red papules from tick bite, no infection noted.   BMET    Component Value Date/Time   NA 139 08/06/2015 0828   K 3.6 08/06/2015 0828   CL 102 08/06/2015 0828   CO2 24 08/06/2015 0828   GLUCOSE 98 08/06/2015 0828   BUN 9 08/06/2015 0828   CREATININE 0.69 08/06/2015 0828   CALCIUM 9.2  08/06/2015 0828   GFRNONAA >60 08/06/2015 0828   GFRAA >60 08/06/2015 0828    Lipid Panel  No results found for: CHOL, TRIG, HDL, CHOLHDL, VLDL, LDLCALC  CBC    Component Value Date/Time   WBC 8.5 08/06/2015 0828   RBC 4.16 08/06/2015 0828   HGB 13.4 08/06/2015 0828   HCT 39.3 08/06/2015 0828   PLT 223 08/06/2015 0828   MCV 94.5 08/06/2015 0828   MCH 32.2 08/06/2015 0828   MCHC 34.1 08/06/2015 0828   RDW 14.2 08/06/2015 0828   LYMPHSABS 2.2 08/06/2015 0828   MONOABS 0.6 08/06/2015 0828   EOSABS 0.3 08/06/2015 0828   BASOSABS 0.0 08/06/2015 0828    Hgb A1C No results found for: HGBA1C   Assessment and Plan:  Tick Bite of Abdomen:  No s/s of infection or tick born illness Can use Hydrocortisone  if needed for itching  Make an appt for your annual exam Webb Silversmith, NP

## 2016-11-17 LAB — T4, FREE: Free T4: 0.73 ng/dL (ref 0.60–1.60)

## 2016-11-17 LAB — TSH: TSH: 2.1 u[IU]/mL (ref 0.35–4.50)

## 2016-11-17 LAB — T3: T3 TOTAL: 74 ng/dL — AB (ref 76–181)

## 2016-11-18 ENCOUNTER — Encounter: Payer: Self-pay | Admitting: Internal Medicine

## 2016-11-24 ENCOUNTER — Encounter: Payer: Self-pay | Admitting: Internal Medicine

## 2016-11-24 NOTE — Telephone Encounter (Signed)
I do not see where you have ever filled this medication, please advise

## 2016-11-25 ENCOUNTER — Telehealth: Payer: Self-pay

## 2016-11-25 NOTE — Telephone Encounter (Addendum)
Sent pt a message in reference to the response of no longer getting Klonopin Rx as it was not detected in her drug screen

## 2016-11-25 NOTE — Telephone Encounter (Signed)
recent UDS from 11/16/16. Klonopin was not detected.... Pt states she had actually run out a few days prior to UDS and pt takes at least 4-5 nights a week

## 2016-11-25 NOTE — Telephone Encounter (Signed)
She had told me that she still had some left because I asked her that day if she needed a refill. I will not fill Clonazepam for her.

## 2016-11-29 NOTE — Telephone Encounter (Signed)
Jearld Fenton, NP  to Me   11/25/16 11:47 AM  Note    She had told me that she still had some left because I asked her that day if she needed a refill. I will not fill Clonazepam for her.

## 2016-11-29 NOTE — Telephone Encounter (Signed)
At your office visit, Courtney Green states she has it noted that you said that you had some medication left and that you take every night. Being that the medication is not in your system, she is no longer going to fill the prescription for the Klonopin.    Last read by Anselmo Rod at 6:39 PM on 11/25/2016

## 2016-12-23 ENCOUNTER — Ambulatory Visit (INDEPENDENT_AMBULATORY_CARE_PROVIDER_SITE_OTHER): Payer: Managed Care, Other (non HMO) | Admitting: Clinical

## 2016-12-23 ENCOUNTER — Encounter: Payer: Self-pay | Admitting: Internal Medicine

## 2016-12-23 DIAGNOSIS — F419 Anxiety disorder, unspecified: Secondary | ICD-10-CM | POA: Diagnosis not present

## 2016-12-23 MED ORDER — SUVOREXANT 10 MG PO TABS: 1.0000 | ORAL_TABLET | Freq: Every day | ORAL | 0 refills | Status: DC

## 2016-12-23 MED ORDER — BUSPIRONE HCL 10 MG PO TABS: 10.0000 mg | ORAL_TABLET | Freq: Two times a day (BID) | ORAL | 2 refills | Status: DC

## 2016-12-23 NOTE — Addendum Note (Signed)
Addended by: Lurlean Nanny on: 12/23/2016 05:34 PM   Modules accepted: Orders

## 2016-12-26 ENCOUNTER — Encounter: Payer: Self-pay | Admitting: Internal Medicine

## 2016-12-26 MED ORDER — BUSPIRONE HCL 10 MG PO TABS: 10.0000 mg | ORAL_TABLET | Freq: Two times a day (BID) | ORAL | 2 refills | Status: DC

## 2016-12-27 ENCOUNTER — Ambulatory Visit: Payer: Managed Care, Other (non HMO) | Admitting: Clinical

## 2017-05-11 ENCOUNTER — Other Ambulatory Visit: Payer: Self-pay | Admitting: Internal Medicine

## 2017-05-12 MED ORDER — BUSPIRONE HCL 10 MG PO TABS: 10.0000 mg | ORAL_TABLET | Freq: Two times a day (BID) | ORAL | 1 refills | Status: DC

## 2017-05-12 NOTE — Telephone Encounter (Signed)
CPE due letter mailed  

## 2017-05-25 ENCOUNTER — Other Ambulatory Visit: Payer: Self-pay | Admitting: Internal Medicine

## 2017-05-26 NOTE — Telephone Encounter (Signed)
CPE reminder letter mailed 

## 2017-10-19 ENCOUNTER — Other Ambulatory Visit (HOSPITAL_COMMUNITY): Payer: Self-pay | Admitting: *Deleted

## 2018-07-24 ENCOUNTER — Encounter: Payer: Self-pay | Admitting: Internal Medicine

## 2018-07-24 ENCOUNTER — Ambulatory Visit (INDEPENDENT_AMBULATORY_CARE_PROVIDER_SITE_OTHER): Payer: BLUE CROSS/BLUE SHIELD | Admitting: Internal Medicine

## 2018-07-24 VITALS — BP 98/64 | HR 82 | Temp 98.6°F | Ht 64.0 in | Wt 127.6 lb

## 2018-07-24 DIAGNOSIS — Z23 Encounter for immunization: Secondary | ICD-10-CM | POA: Diagnosis not present

## 2018-07-24 DIAGNOSIS — E039 Hypothyroidism, unspecified: Secondary | ICD-10-CM

## 2018-07-24 DIAGNOSIS — R569 Unspecified convulsions: Secondary | ICD-10-CM

## 2018-07-24 DIAGNOSIS — Z5181 Encounter for therapeutic drug level monitoring: Secondary | ICD-10-CM | POA: Diagnosis not present

## 2018-07-24 DIAGNOSIS — F419 Anxiety disorder, unspecified: Secondary | ICD-10-CM | POA: Diagnosis not present

## 2018-07-24 DIAGNOSIS — F411 Generalized anxiety disorder: Secondary | ICD-10-CM

## 2018-07-24 DIAGNOSIS — M797 Fibromyalgia: Secondary | ICD-10-CM

## 2018-07-24 DIAGNOSIS — G35 Multiple sclerosis: Secondary | ICD-10-CM

## 2018-07-24 DIAGNOSIS — Z1239 Encounter for other screening for malignant neoplasm of breast: Secondary | ICD-10-CM

## 2018-07-24 DIAGNOSIS — F329 Major depressive disorder, single episode, unspecified: Secondary | ICD-10-CM

## 2018-07-24 MED ORDER — ALPRAZOLAM ER 0.5 MG PO TB24: 0.5000 mg | ORAL_TABLET | ORAL | 0 refills | Status: DC

## 2018-07-24 MED ORDER — TRAZODONE HCL 100 MG PO TABS: 100.0000 mg | ORAL_TABLET | Freq: Every day | ORAL | 0 refills | Status: DC

## 2018-07-24 MED ORDER — DULOXETINE HCL 60 MG PO CPEP: 60.0000 mg | ORAL_CAPSULE | Freq: Every day | ORAL | 3 refills | Status: DC

## 2018-07-24 NOTE — Progress Notes (Signed)
Spoke with pharmacist and refill for Xanax has been denied due to patient refusing to leave an urine sample.

## 2018-07-24 NOTE — Addendum Note (Signed)
Addended by: Gwynne Edinger on: 07/24/2018 05:21 PM   Modules accepted: Orders

## 2018-07-24 NOTE — Patient Instructions (Addendum)
-  It was nice meeting you today!  -Labwork today. Will notify you when results are available.  -Flu shot today.  -I will refer you to psychiatry. Please note that continued prescription of Xanax and trazodone depends on you making this appointment.  -Schedule follow up in 3 months for annual physical. Please come in fasting to that appointment.

## 2018-07-24 NOTE — Progress Notes (Signed)
Established Patient Office Visit     CC/Reason for Visit: Establish care, medication refills, follow-up on chronic medical conditions  HPI: Courtney Green is a 53 y.o. female who is coming in today for the above mentioned reasons. Past Medical History is significant for: Anxiety and depression, fibromyalgia, insomnia, multiple sclerosis.,  She sees a Dr. Sabra Heck who is a neurologist in [facility] for her multiple sclerosis, he prescribes gabapentin and baclofen.  She has never seen a psychiatrist or behavioral health counselor.  Interestingly she has had at least 5 different providers over the past few years as primary care physician.  There were some inconsistencies in a previous UDS and at that point decision was made to no longer prescribe Klonopin.  She has a "flareup of her fibromyalgia" and attributes this to running out of Xanax.  She has a history of hypothyroidism and is on Synthroid 100 mcg daily.  She says that she knows that her TSH is abnormal because she has had fluctuating weight and increased hair loss and would like her TSH checked today.  She has had grand mal seizures in the past and was prescribed Keppra, however she stopped taking this in October since she no longer believes that she needs it.  She is also on Cymbalta 60 mg daily that she would like refilled today.  She requests a flu shot today, she requests a mammogram.  She is due for annual physical exam.   Past Medical/Surgical History: Past Medical History:  Diagnosis Date  . Allergy   . Anxiety   . Depression   . Fibromyalgia   . Headache(784.0)   . Insomnia   . Seizures ([place])   . Thyroid disease   . Thyroid disease     Past Surgical History:  Procedure Laterality Date  . ABDOMINAL HYSTERECTOMY      Social History:  reports that she quit smoking about 5 years ago. Her smoking use included cigarettes. She smoked 0.20 packs per day. She has never used smokeless tobacco. She reports  current alcohol use. She reports that she does not use drugs.  Allergies: Allergies  Allergen Reactions  . Aspirin Nausea Only  . Morphine And Related Itching    Family History:  Family History  Problem Relation Age of Onset  . Birth defects Maternal Uncle        thyroid   . Diabetes Neg Hx      Current Outpatient Medications:  .  ALPRAZolam (XANAX XR) 0.5 MG 24 hr tablet, Take 1 tablet (0.5 mg total) by mouth every morning., Disp: 30 tablet, Rfl: 0 .  benzonatate (TESSALON) 100 MG capsule, Take 100 mg by mouth at bedtime as needed. cough, Disp: , Rfl: 1 .  DULoxetine (CYMBALTA) 60 MG capsule, Take 1 capsule (60 mg total) by mouth at bedtime., Disp: 30 capsule, Rfl: 3 .  fluticasone (FLONASE) 50 MCG/ACT nasal spray, Place 1 spray into both nostrils daily., Disp: 16 g, Rfl: 1 .  gabapentin (NEURONTIN) 300 MG capsule, Take 300 mg by mouth 4 (four) times daily as needed., Disp: , Rfl:  .  SYNTHROID 100 MCG tablet, Take 100 mcg by mouth daily., Disp: , Rfl: 5 .  traZODone (DESYREL) 100 MG tablet, Take 1 tablet (100 mg total) by mouth at bedtime., Disp: 30 tablet, Rfl: 0 .  levETIRAcetam (KEPPRA) 500 MG tablet, Take 500 mg by mouth Nightly., Disp: , Rfl:   Review of Systems:  Constitutional: Denies fever, chills, diaphoresis, appetite  change and fatigue.  HEENT: Denies photophobia, eye pain, redness, hearing loss, ear pain, congestion, sore throat, rhinorrhea, sneezing, mouth sores, trouble swallowing, neck pain, neck stiffness and tinnitus.   Respiratory: Denies SOB, DOE, cough, chest tightness,  and wheezing.   Cardiovascular: Denies chest pain, palpitations and leg swelling.  Gastrointestinal: Denies nausea, vomiting, abdominal pain, diarrhea, constipation, blood in stool and abdominal distention.  Genitourinary: Denies dysuria, urgency, frequency, hematuria, flank pain and difficulty urinating.  Endocrine: Denies: hot or cold intolerance, sweats, changes in hair or nails, polyuria,  polydipsia. Musculoskeletal: Denies myalgias, back pain, joint swelling, arthralgias and gait problem.  Skin: Denies pallor, rash and wound.  Neurological: Denies dizziness, seizures, syncope, weakness, light-headedness, numbness and headaches.  Hematological: Denies adenopathy. Easy bruising, personal or family bleeding history  Psychiatric/Behavioral: Denies suicidal ideation, mood changes, confusion, nervousness, sleep disturbance and agitation    Physical Exam: Vitals:   07/24/18 1617  BP: 98/64  Pulse: 82  Temp: 98.6 F (37 C)  TempSrc: Oral  SpO2: 95%  Weight: 127 lb 9.6 oz (57.9 kg)  Height: 5\' 4"  (1.626 m)    Body mass index is 21.9 kg/m.   Constitutional: NAD, calm, comfortable Eyes: PERRL, lids and conjunctivae normal ENMT: Mucous membranes are moist.  Neck: normal, supple, no masses, no thyromegaly Respiratory: clear to auscultation bilaterally, no wheezing, no crackles. Normal respiratory effort. No accessory muscle use.  Cardiovascular: Regular rate and rhythm, no murmurs / rubs / gallops. No extremity edema. 2+ pedal pulses. No carotid bruits.  Abdomen: no tenderness, no masses palpated. No hepatosplenomegaly. Bowel sounds positive.  Musculoskeletal: no clubbing / cyanosis. No joint deformity upper and lower extremities. Good ROM, no contractures. Normal muscle tone.  Skin: no rashes, lesions, ulcers. No induration Neurologic: CN 2-12 grossly intact. Sensation intact, DTR normal. Strength 5/5 in all 4.  Psychiatric: Normal judgment and insight. Alert and oriented x 3. Normal mood.    Impression and Plan:  Screening for breast cancer  -Mammogram requested for today.  Acquired hypothyroidism  -On Synthroid 100 mcg daily.  "Generic does not work only branded Synthroid". -She would like her TSH checked today, was last checked in May 2018 at that time it was normal at 2.1. -She states she has had weight changes and hair loss and believes this is due to an  abnormal TSH value.  Anxiety and depression Fibromyalgia -She is requesting refills of Cymbalta, trazodone, Xanax today. -I have reviewed the [place] controlled substance database and she last refilled 30 tablets 0.5 mg of alprazolam on August 22 of 2019.  I do not see any red flags.  It is concerning to me however the fact that she has had multiple primary care physicians over the past few years, in 2018 she was denied a prescription of Klonopin due to an inconsistent UDS result. -I do believe she needs to follow-up with psychiatry due to what she describes as overwhelming anxiety. -I am willing to give her a months worth of Xanax and trazodone but she will be required to show documentation that she has made appointment with psychiatry prior to further refills being dispensed, additionally once she sees psychiatry they will need to assume prescription of Xanax if they believe it is a needed medication.  MS (multiple sclerosis) ([place]) -Follows with Dr. Sabra Heck neurology in [place], will obtain records.  Seizures ([place]) -She elected to discontinue Keppra without discussing this with her neurologist.  She has been off of it since October.  She sees no need  to resume.  Needs flu shot - Plan: Flu Vaccine QUAD 6+ mos PF IM (Fluarix Quad PF)    Patient Instructions  -It was nice meeting you today!  -Labwork today. Will notify you when results are available.  -Flu shot today.  -I will refer you to psychiatry. Please note that continued prescription of Xanax and trazodone depends on you making this appointment.  -Schedule follow up in 3 months for annual physical. Please come in fasting to that appointment.     Lelon Frohlich, MD [facility]

## 2018-07-25 ENCOUNTER — Other Ambulatory Visit: Payer: Self-pay | Admitting: Internal Medicine

## 2018-07-25 ENCOUNTER — Other Ambulatory Visit: Payer: Self-pay | Admitting: *Deleted

## 2018-07-25 DIAGNOSIS — E039 Hypothyroidism, unspecified: Secondary | ICD-10-CM

## 2018-07-25 LAB — TSH: TSH: 0.21 u[IU]/mL — ABNORMAL LOW (ref 0.35–4.50)

## 2018-07-25 MED ORDER — LEVOTHYROXINE SODIUM 88 MCG PO TABS: 88.0000 ug | ORAL_TABLET | Freq: Every day | ORAL | 3 refills | Status: DC

## 2018-07-25 NOTE — Progress Notes (Signed)
Medication resent to [facility] on [address]  per pt request.

## 2018-07-28 ENCOUNTER — Other Ambulatory Visit: Payer: Self-pay | Admitting: Internal Medicine

## 2018-07-28 DIAGNOSIS — F329 Major depressive disorder, single episode, unspecified: Secondary | ICD-10-CM

## 2018-07-28 DIAGNOSIS — F419 Anxiety disorder, unspecified: Principal | ICD-10-CM

## 2018-07-31 MED ORDER — DULOXETINE HCL 60 MG PO CPEP: 60.0000 mg | ORAL_CAPSULE | Freq: Every day | ORAL | 1 refills | Status: DC

## 2018-07-31 NOTE — Addendum Note (Signed)
Addended by: Westley Hummer B on: 07/31/2018 12:03 PM   Modules accepted: Orders

## 2018-08-16 ENCOUNTER — Other Ambulatory Visit: Payer: Self-pay | Admitting: Internal Medicine

## 2018-08-16 DIAGNOSIS — F419 Anxiety disorder, unspecified: Principal | ICD-10-CM

## 2018-08-16 DIAGNOSIS — F329 Major depressive disorder, single episode, unspecified: Secondary | ICD-10-CM

## 2018-08-16 DIAGNOSIS — F32A Depression, unspecified: Secondary | ICD-10-CM

## 2018-08-16 NOTE — Telephone Encounter (Signed)
Per my last note, she was required to make an appointment with psych prior to my dispensing further trazadone and benzo refills. Did this happen?

## 2018-08-20 ENCOUNTER — Other Ambulatory Visit: Payer: Self-pay | Admitting: Internal Medicine

## 2018-08-20 DIAGNOSIS — F419 Anxiety disorder, unspecified: Principal | ICD-10-CM

## 2018-08-20 DIAGNOSIS — F329 Major depressive disorder, single episode, unspecified: Secondary | ICD-10-CM

## 2018-08-20 DIAGNOSIS — F32A Depression, unspecified: Secondary | ICD-10-CM

## 2018-08-20 NOTE — Telephone Encounter (Signed)
Last Rx given for #30 on 1/14 with no ref

## 2018-08-21 NOTE — Telephone Encounter (Signed)
I called the pt and informed her of the message below.  She stated she has not made an appt as she did not know she was supposed to do this.  I advised the pt the referral was placed on 1/14 to [facility] and she stated she has not heard from anyone.  I called Behavioral Health at [phone] and spoke with Valentina Lucks to inform her of this.  She stated Sunday Spillers will call the pt with appt info and I called the pt to inform her of this.  Patient stated she was upset after her appt due to Dr Jerilee Hoh ordering a drug screen behind her back as she did not know this was needed until she went to the lab and has to pay for this test and some other report was done.  She stated "not everybody is on drugs, she does drug screens herself and should have been told this".  Message forwarded to Dr Jerilee Hoh.

## 2018-08-21 NOTE — Telephone Encounter (Signed)
This is from note dated 1/14:  -I am willing to give her a months worth of Xanax and trazodone but she will be required to show documentation that she has made appointment with psychiatry prior to further refills being dispensed, additionally once she sees psychiatry they will need to assume prescription of Xanax if they believe it is a needed medication.  Did she make this appointment?

## 2018-08-21 NOTE — Telephone Encounter (Signed)
I called the pt and informed her of the message below

## 2018-08-21 NOTE — Telephone Encounter (Signed)
Since she has made appt with psych, will refill trazodone for 30 days. What does "she does drug screens herself" mean? I cannot recall specifics of her case, but doubt we would have not informed her about necessity of drug screening her that day.

## 2018-08-30 ENCOUNTER — Inpatient Hospital Stay: Admission: RE | Admit: 2018-08-30 | Payer: Self-pay | Source: Ambulatory Visit

## 2018-09-26 ENCOUNTER — Ambulatory Visit: Payer: Self-pay

## 2018-10-17 ENCOUNTER — Ambulatory Visit: Payer: Self-pay

## 2018-10-17 DIAGNOSIS — Z720 Tobacco use: Secondary | ICD-10-CM | POA: Diagnosis not present

## 2018-10-17 DIAGNOSIS — G35 Multiple sclerosis: Secondary | ICD-10-CM | POA: Diagnosis not present

## 2018-10-17 DIAGNOSIS — E039 Hypothyroidism, unspecified: Secondary | ICD-10-CM | POA: Diagnosis not present

## 2018-10-17 DIAGNOSIS — R55 Syncope and collapse: Secondary | ICD-10-CM | POA: Diagnosis not present

## 2018-10-17 DIAGNOSIS — Z6822 Body mass index (BMI) 22.0-22.9, adult: Secondary | ICD-10-CM | POA: Diagnosis not present

## 2018-10-24 ENCOUNTER — Encounter: Payer: BLUE CROSS/BLUE SHIELD | Admitting: Internal Medicine

## 2018-10-25 DIAGNOSIS — E063 Autoimmune thyroiditis: Secondary | ICD-10-CM | POA: Diagnosis not present

## 2018-10-25 DIAGNOSIS — R7301 Impaired fasting glucose: Secondary | ICD-10-CM | POA: Diagnosis not present

## 2018-10-25 DIAGNOSIS — E039 Hypothyroidism, unspecified: Secondary | ICD-10-CM | POA: Diagnosis not present

## 2018-10-29 DIAGNOSIS — F5101 Primary insomnia: Secondary | ICD-10-CM | POA: Diagnosis not present

## 2018-10-31 DIAGNOSIS — G35 Multiple sclerosis: Secondary | ICD-10-CM | POA: Diagnosis not present

## 2018-10-31 DIAGNOSIS — E063 Autoimmune thyroiditis: Secondary | ICD-10-CM | POA: Diagnosis not present

## 2018-10-31 DIAGNOSIS — G4700 Insomnia, unspecified: Secondary | ICD-10-CM | POA: Diagnosis not present

## 2018-10-31 DIAGNOSIS — Z0000 Encounter for general adult medical examination without abnormal findings: Secondary | ICD-10-CM | POA: Diagnosis not present

## 2018-11-06 DIAGNOSIS — R208 Other disturbances of skin sensation: Secondary | ICD-10-CM | POA: Diagnosis not present

## 2018-11-06 DIAGNOSIS — R202 Paresthesia of skin: Secondary | ICD-10-CM | POA: Diagnosis not present

## 2018-11-06 DIAGNOSIS — Z7409 Other reduced mobility: Secondary | ICD-10-CM | POA: Diagnosis not present

## 2018-12-06 DIAGNOSIS — R7301 Impaired fasting glucose: Secondary | ICD-10-CM | POA: Diagnosis not present

## 2018-12-06 DIAGNOSIS — E039 Hypothyroidism, unspecified: Secondary | ICD-10-CM | POA: Diagnosis not present

## 2018-12-11 DIAGNOSIS — R200 Anesthesia of skin: Secondary | ICD-10-CM | POA: Diagnosis not present

## 2018-12-11 DIAGNOSIS — Z7289 Other problems related to lifestyle: Secondary | ICD-10-CM | POA: Diagnosis not present

## 2018-12-11 DIAGNOSIS — R9089 Other abnormal findings on diagnostic imaging of central nervous system: Secondary | ICD-10-CM | POA: Diagnosis not present

## 2018-12-11 DIAGNOSIS — G35 Multiple sclerosis: Secondary | ICD-10-CM | POA: Diagnosis not present

## 2018-12-11 DIAGNOSIS — Z8349 Family history of other endocrine, nutritional and metabolic diseases: Secondary | ICD-10-CM | POA: Diagnosis not present

## 2018-12-11 DIAGNOSIS — E559 Vitamin D deficiency, unspecified: Secondary | ICD-10-CM | POA: Diagnosis not present

## 2018-12-11 DIAGNOSIS — Z820 Family history of epilepsy and other diseases of the nervous system: Secondary | ICD-10-CM | POA: Diagnosis not present

## 2018-12-18 ENCOUNTER — Other Ambulatory Visit: Payer: Self-pay | Admitting: Neurology

## 2018-12-18 DIAGNOSIS — G35 Multiple sclerosis: Secondary | ICD-10-CM

## 2019-01-02 ENCOUNTER — Other Ambulatory Visit: Payer: Self-pay

## 2019-01-02 ENCOUNTER — Ambulatory Visit
Admission: RE | Admit: 2019-01-02 | Discharge: 2019-01-02 | Disposition: A | Discharge status: Home / Self Care | Payer: Medicare Other | Source: Ambulatory Visit | Attending: Neurology | Admitting: Neurology

## 2019-01-02 DIAGNOSIS — M546 Pain in thoracic spine: Secondary | ICD-10-CM | POA: Diagnosis not present

## 2019-01-02 DIAGNOSIS — M47812 Spondylosis without myelopathy or radiculopathy, cervical region: Secondary | ICD-10-CM | POA: Diagnosis not present

## 2019-01-02 DIAGNOSIS — M4802 Spinal stenosis, cervical region: Secondary | ICD-10-CM | POA: Diagnosis not present

## 2019-01-02 DIAGNOSIS — M4302 Spondylolysis, cervical region: Secondary | ICD-10-CM | POA: Diagnosis not present

## 2019-01-02 DIAGNOSIS — G35 Multiple sclerosis: Secondary | ICD-10-CM

## 2019-01-02 DIAGNOSIS — M549 Dorsalgia, unspecified: Secondary | ICD-10-CM | POA: Diagnosis not present

## 2019-01-04 ENCOUNTER — Ambulatory Visit
Admission: RE | Admit: 2019-01-04 | Discharge: 2019-01-04 | Disposition: A | Discharge status: Home / Self Care | Payer: Medicare Other | Source: Ambulatory Visit | Attending: Neurology | Admitting: Neurology

## 2019-01-04 ENCOUNTER — Other Ambulatory Visit: Payer: Self-pay

## 2019-01-04 DIAGNOSIS — G939 Disorder of brain, unspecified: Secondary | ICD-10-CM | POA: Diagnosis not present

## 2019-01-04 DIAGNOSIS — G35 Multiple sclerosis: Secondary | ICD-10-CM

## 2019-01-04 MED ORDER — GADOBENATE DIMEGLUMINE 529 MG/ML IV SOLN
12.0000 mL | Freq: Once | INTRAVENOUS | Status: AC | PRN
  Administered 2019-01-04: 12 mL via INTRAVENOUS

## 2019-01-09 DIAGNOSIS — L509 Urticaria, unspecified: Secondary | ICD-10-CM | POA: Diagnosis not present

## 2019-01-28 DIAGNOSIS — L509 Urticaria, unspecified: Secondary | ICD-10-CM | POA: Diagnosis not present

## 2019-01-31 DIAGNOSIS — R9089 Other abnormal findings on diagnostic imaging of central nervous system: Secondary | ICD-10-CM | POA: Diagnosis not present

## 2019-01-31 DIAGNOSIS — G35 Multiple sclerosis: Secondary | ICD-10-CM | POA: Diagnosis not present

## 2019-01-31 DIAGNOSIS — E063 Autoimmune thyroiditis: Secondary | ICD-10-CM | POA: Diagnosis not present

## 2019-02-11 ENCOUNTER — Encounter: Payer: Self-pay | Admitting: Internal Medicine

## 2019-02-11 NOTE — Telephone Encounter (Signed)
I contacted the patient and the patient said that she done that by mistake. She stated that she does not she Dr. Jerilee Hoh anymore.

## 2019-02-12 DIAGNOSIS — E039 Hypothyroidism, unspecified: Secondary | ICD-10-CM | POA: Diagnosis not present

## 2019-04-15 DIAGNOSIS — E063 Autoimmune thyroiditis: Secondary | ICD-10-CM | POA: Diagnosis not present

## 2019-04-15 DIAGNOSIS — D72819 Decreased white blood cell count, unspecified: Secondary | ICD-10-CM | POA: Diagnosis not present

## 2019-04-15 DIAGNOSIS — R9089 Other abnormal findings on diagnostic imaging of central nervous system: Secondary | ICD-10-CM | POA: Diagnosis not present

## 2019-04-15 DIAGNOSIS — G35 Multiple sclerosis: Secondary | ICD-10-CM | POA: Diagnosis not present

## 2019-04-15 DIAGNOSIS — R200 Anesthesia of skin: Secondary | ICD-10-CM | POA: Diagnosis not present

## 2019-04-30 DIAGNOSIS — R519 Headache, unspecified: Secondary | ICD-10-CM | POA: Diagnosis not present

## 2019-04-30 DIAGNOSIS — G35 Multiple sclerosis: Secondary | ICD-10-CM | POA: Diagnosis not present

## 2019-04-30 DIAGNOSIS — R229 Localized swelling, mass and lump, unspecified: Secondary | ICD-10-CM | POA: Diagnosis not present

## 2019-04-30 DIAGNOSIS — G4700 Insomnia, unspecified: Secondary | ICD-10-CM | POA: Diagnosis not present

## 2019-05-07 DIAGNOSIS — D485 Neoplasm of uncertain behavior of skin: Secondary | ICD-10-CM | POA: Diagnosis not present

## 2019-05-07 DIAGNOSIS — L821 Other seborrheic keratosis: Secondary | ICD-10-CM | POA: Diagnosis not present

## 2019-05-07 DIAGNOSIS — L578 Other skin changes due to chronic exposure to nonionizing radiation: Secondary | ICD-10-CM | POA: Diagnosis not present

## 2019-05-07 DIAGNOSIS — L723 Sebaceous cyst: Secondary | ICD-10-CM | POA: Diagnosis not present

## 2019-05-15 DIAGNOSIS — D485 Neoplasm of uncertain behavior of skin: Secondary | ICD-10-CM | POA: Diagnosis not present

## 2019-05-15 DIAGNOSIS — L723 Sebaceous cyst: Secondary | ICD-10-CM | POA: Diagnosis not present

## 2019-05-27 DIAGNOSIS — G35 Multiple sclerosis: Secondary | ICD-10-CM | POA: Diagnosis not present

## 2019-05-27 DIAGNOSIS — R9089 Other abnormal findings on diagnostic imaging of central nervous system: Secondary | ICD-10-CM | POA: Diagnosis not present

## 2019-05-27 DIAGNOSIS — M62838 Other muscle spasm: Secondary | ICD-10-CM | POA: Diagnosis not present

## 2019-05-27 DIAGNOSIS — E063 Autoimmune thyroiditis: Secondary | ICD-10-CM | POA: Diagnosis not present

## 2019-06-10 DIAGNOSIS — R0781 Pleurodynia: Secondary | ICD-10-CM | POA: Diagnosis not present

## 2019-06-10 DIAGNOSIS — R05 Cough: Secondary | ICD-10-CM | POA: Diagnosis not present

## 2019-06-11 DIAGNOSIS — G35 Multiple sclerosis: Secondary | ICD-10-CM | POA: Diagnosis not present

## 2019-07-10 DIAGNOSIS — G35 Multiple sclerosis: Secondary | ICD-10-CM | POA: Diagnosis not present

## 2019-07-19 ENCOUNTER — Other Ambulatory Visit: Payer: Self-pay | Admitting: Internal Medicine

## 2019-07-19 DIAGNOSIS — F329 Major depressive disorder, single episode, unspecified: Secondary | ICD-10-CM

## 2019-07-19 DIAGNOSIS — F32A Depression, unspecified: Secondary | ICD-10-CM

## 2019-07-25 DIAGNOSIS — G35 Multiple sclerosis: Secondary | ICD-10-CM | POA: Diagnosis not present

## 2019-08-02 DIAGNOSIS — R519 Headache, unspecified: Secondary | ICD-10-CM | POA: Diagnosis not present

## 2019-08-13 DIAGNOSIS — E039 Hypothyroidism, unspecified: Secondary | ICD-10-CM | POA: Diagnosis not present

## 2019-08-16 DIAGNOSIS — R519 Headache, unspecified: Secondary | ICD-10-CM | POA: Diagnosis not present

## 2019-10-14 IMAGING — MR MRI HEAD WITHOUT AND WITH CONTRAST
13 series · 48 of 48 positions shown · IV contrast (multihance)
Comparison: Brain MRI 08/06/2015

CLINICAL DATA: Headaches with weakness and numbness. History of
multiple sclerosis.

EXAM:
MRI HEAD WITHOUT AND WITH CONTRAST
TECHNIQUE: Multiplanar, multiecho pulse sequences of the brain and surrounding
structures were obtained without and with intravenous contrast.
CONTRAST:  12mL MULTIHANCE GADOBENATE DIMEGLUMINE 529 MG/ML IV SOLN

[Series 5: T1 · sagittal · 4.0mm · 0.75mm/px · 1 of 31 slices shown (1 of 3)]
[im 1/31]
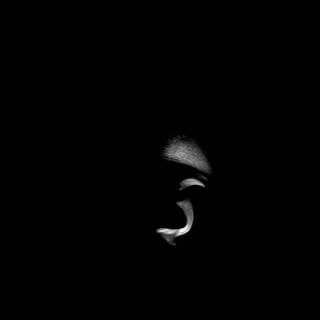

[Series 6: DWI · axial · 3.0mm · 1.44mm/px · z∈[-31,+106]mm · 4 of 86 slices shown (1 of 4)]
[im 1/86]
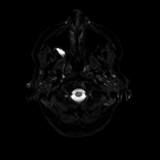
[im 29/86]
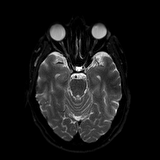
[im 57/86]
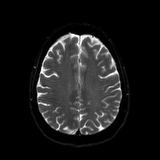
[im 86/86]
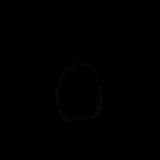

[Series 7: DWI · axial · 3.0mm · 1.44mm/px · z∈[-31,+106]mm · 3 of 43 slices shown (2 of 4)]
[im 1/43]
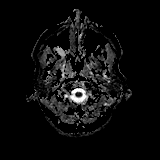
[im 22/43]
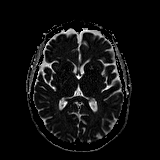
[im 43/43]
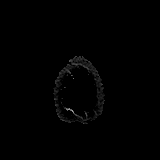

[Series 8: DWI · coronal · 5.0mm · 1.44mm/px · 4 of 64 slices shown (3 of 4)]
[im 1/64]
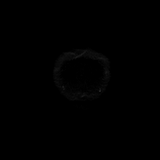
[im 22/64]
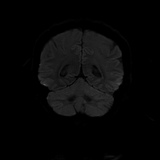
[im 43/64]
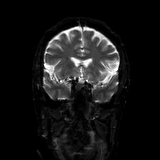
[im 64/64]
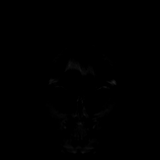

[Series 9: DWI · coronal · 5.0mm · 1.44mm/px · 2 of 32 slices shown (4 of 4)]
[im 1/32]
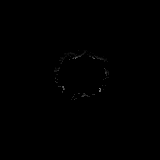
[im 32/32]
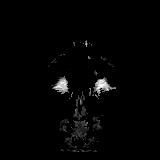

[Series 10: T2 · axial · 4.0mm · 0.36mm/px · z∈[-34,+100]mm · 2 of 27 slices shown]
[im 1/27]
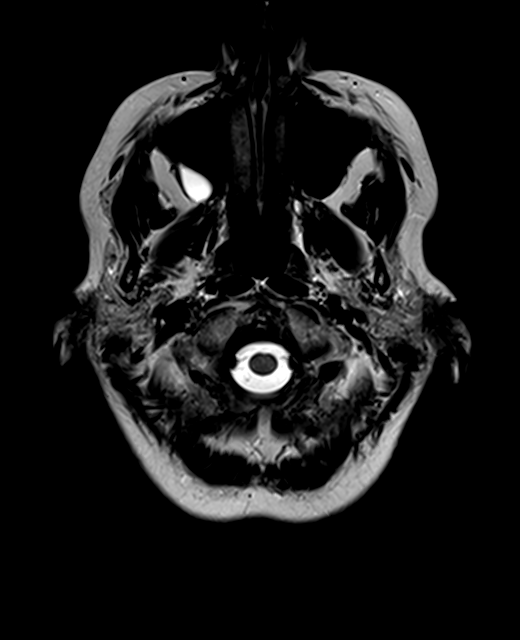
[im 27/27]
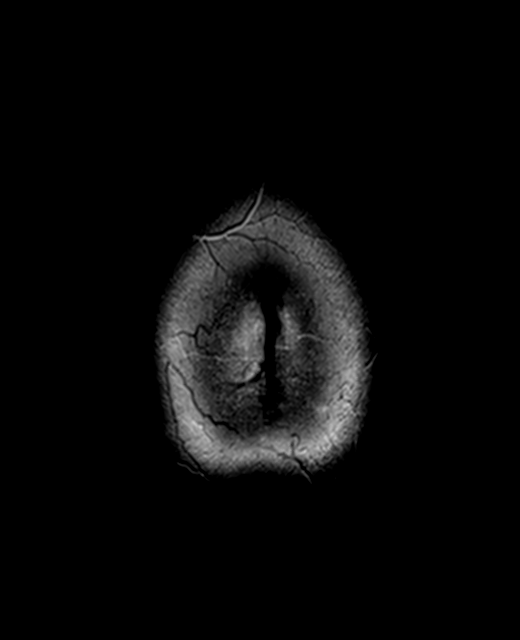

[Series 11: FLAIR · axial · 3.0mm · 0.72mm/px · z∈[-40,+109]mm · 2 of 26 slices shown (1 of 2)]
[im 1/26]
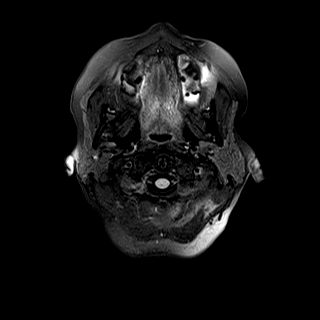
[im 26/26]
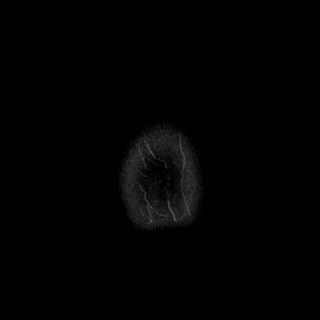

[Series 13: swi_images · axial · 1.5mm · 0.90mm/px · z∈[-37,+103]mm · 6 of 96 slices shown]
[im 1/96]
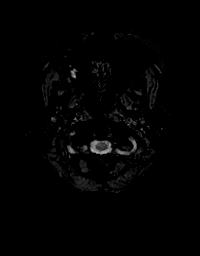
[im 20/96]
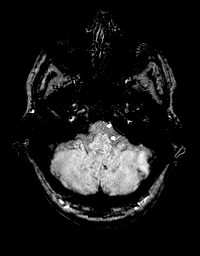
[im 39/96]
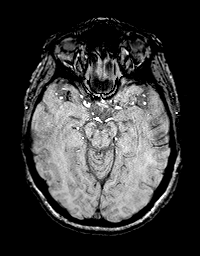
[im 58/96]
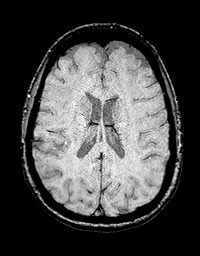
[im 77/96]
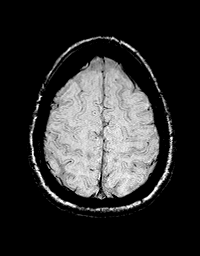
[im 96/96]
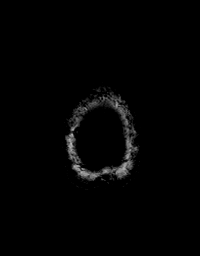

[Series 14: T1 · axial · 1.0mm · 0.90mm/px · z∈[-38,+103]mm · 9 of 144 slices shown (2 of 3)]
[im 1/144]
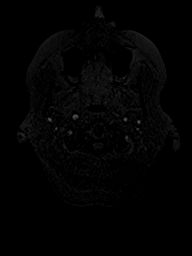
[im 18/144]
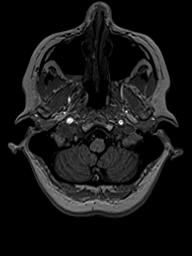
[im 36/144]
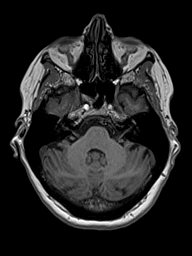
[im 54/144]
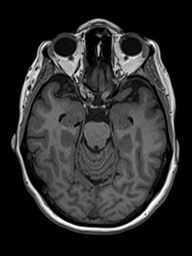
[im 72/144]
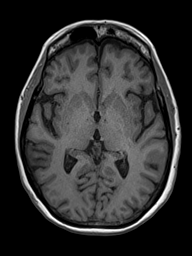
[im 90/144]
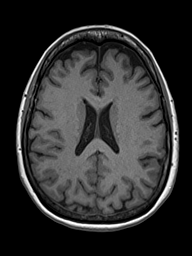
[im 108/144]
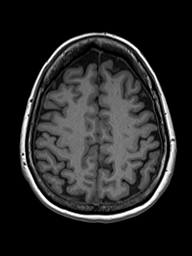
[im 126/144]
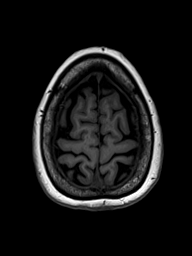
[im 144/144]
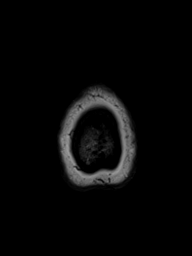

[Series 15: FLAIR · sagittal · 4.0mm · 0.72mm/px · 2 of 26 slices shown (2 of 2)]
[im 1/26]
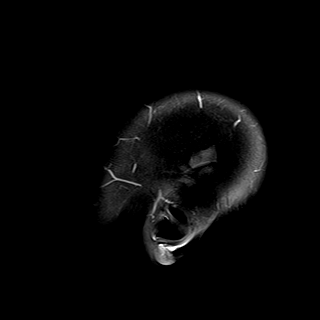
[im 26/26]
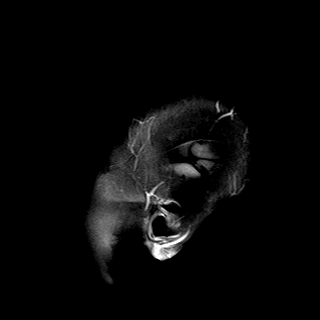

[Series 16: T2 post-contrast · coronal · 4.0mm · 0.36mm/px · 2 of 34 slices shown]
[im 1/34]
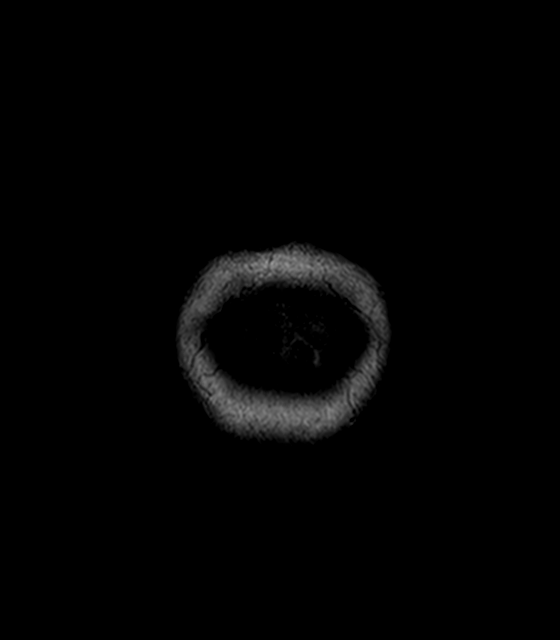
[im 34/34]
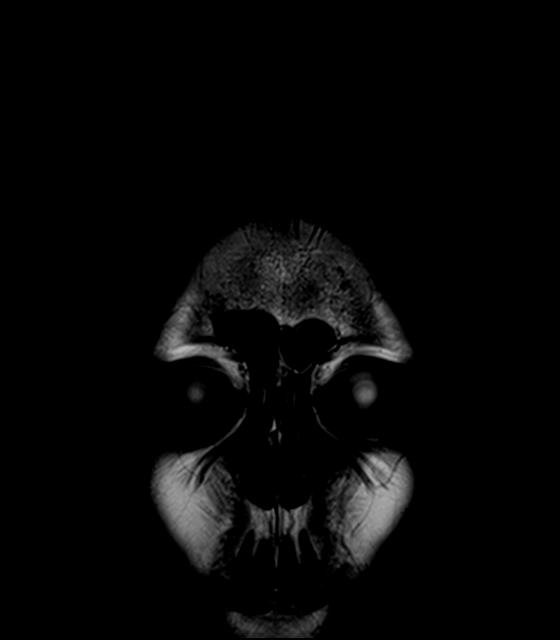

[Series 17: T1 · axial · 1.0mm · 0.90mm/px · z∈[-38,+103]mm · 9 of 144 slices shown (3 of 3)]
[im 1/144]
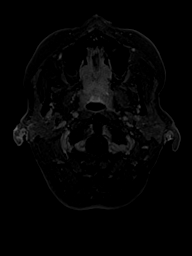
[im 18/144]
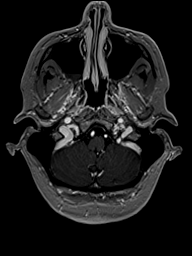
[im 36/144]
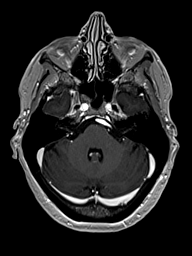
[im 54/144]
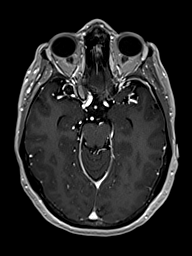
[im 72/144]
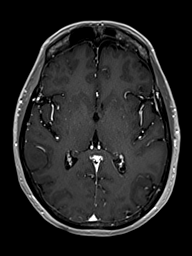
[im 90/144]
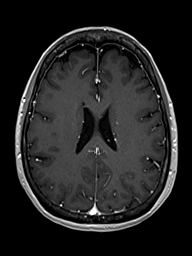
[im 108/144]
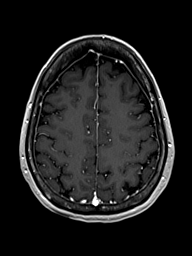
[im 126/144]
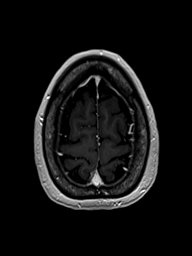
[im 144/144]
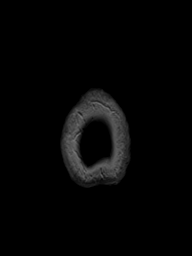

[Series 18: T1 post-contrast · coronal · 4.0mm · 0.72mm/px · 2 of 34 slices shown]
[im 1/34]
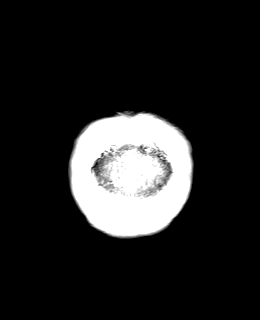
[im 34/34]
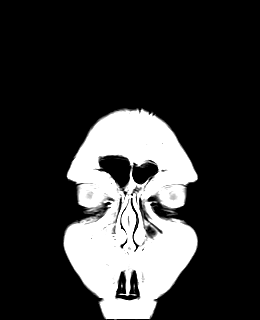

[48 of 48 positions shown; findings below may reference images not displayed]

FINDINGS: BRAIN: There is no acute infarct, acute hemorrhage or extra-axial
collection. The midline structures are normal. There are few small,
scattered, nonspecific hyperintensities of the supratentorial white
matter, the largest of which are located in the left hemisphere. The
lesions are unchanged. There are no contrast-enhancing lesions. The
cerebral and cerebellar volume are age-appropriate. No
hydrocephalus. Susceptibility-sensitive sequences show a single
focus of chronic microhemorrhage in the right periatrial white
matter. No midline shift or other mass effect.

VASCULAR: The major intracranial arterial and venous sinus flow
voids are normal.

SKULL AND UPPER CERVICAL SPINE: Calvarial bone marrow signal is
normal. There is no skull base mass. Visualized upper cervical spine
and soft tissues are normal.

SINUSES/ORBITS: No fluid levels or advanced mucosal thickening. No
mastoid or middle ear effusion. The orbits are normal.
IMPRESSION: Unchanged examination with multiple nonspecific D98N-hyperintense
white matter lesions.

## 2019-10-28 DIAGNOSIS — R531 Weakness: Secondary | ICD-10-CM | POA: Diagnosis not present

## 2019-10-28 DIAGNOSIS — D72819 Decreased white blood cell count, unspecified: Secondary | ICD-10-CM | POA: Diagnosis not present

## 2019-10-28 DIAGNOSIS — Z0000 Encounter for general adult medical examination without abnormal findings: Secondary | ICD-10-CM | POA: Diagnosis not present

## 2019-10-28 DIAGNOSIS — R200 Anesthesia of skin: Secondary | ICD-10-CM | POA: Diagnosis not present

## 2019-10-28 DIAGNOSIS — G35 Multiple sclerosis: Secondary | ICD-10-CM | POA: Diagnosis not present

## 2019-10-28 DIAGNOSIS — M792 Neuralgia and neuritis, unspecified: Secondary | ICD-10-CM | POA: Diagnosis not present

## 2019-11-04 DIAGNOSIS — M4722 Other spondylosis with radiculopathy, cervical region: Secondary | ICD-10-CM | POA: Diagnosis not present

## 2019-11-04 DIAGNOSIS — M25511 Pain in right shoulder: Secondary | ICD-10-CM | POA: Diagnosis not present

## 2019-11-04 DIAGNOSIS — E039 Hypothyroidism, unspecified: Secondary | ICD-10-CM | POA: Diagnosis not present

## 2019-11-04 DIAGNOSIS — G43909 Migraine, unspecified, not intractable, without status migrainosus: Secondary | ICD-10-CM | POA: Diagnosis not present

## 2019-11-04 DIAGNOSIS — Z0000 Encounter for general adult medical examination without abnormal findings: Secondary | ICD-10-CM | POA: Diagnosis not present

## 2019-11-13 DIAGNOSIS — L237 Allergic contact dermatitis due to plants, except food: Secondary | ICD-10-CM | POA: Diagnosis not present

## 2019-11-27 DIAGNOSIS — Z7689 Persons encountering health services in other specified circumstances: Secondary | ICD-10-CM | POA: Diagnosis not present

## 2019-12-02 DIAGNOSIS — M9901 Segmental and somatic dysfunction of cervical region: Secondary | ICD-10-CM | POA: Diagnosis not present

## 2019-12-02 DIAGNOSIS — M50322 Other cervical disc degeneration at C5-C6 level: Secondary | ICD-10-CM | POA: Diagnosis not present

## 2019-12-03 DIAGNOSIS — M50322 Other cervical disc degeneration at C5-C6 level: Secondary | ICD-10-CM | POA: Diagnosis not present

## 2019-12-03 DIAGNOSIS — M9901 Segmental and somatic dysfunction of cervical region: Secondary | ICD-10-CM | POA: Diagnosis not present

## 2019-12-04 DIAGNOSIS — M9901 Segmental and somatic dysfunction of cervical region: Secondary | ICD-10-CM | POA: Diagnosis not present

## 2019-12-04 DIAGNOSIS — M50322 Other cervical disc degeneration at C5-C6 level: Secondary | ICD-10-CM | POA: Diagnosis not present

## 2019-12-13 DIAGNOSIS — M4722 Other spondylosis with radiculopathy, cervical region: Secondary | ICD-10-CM | POA: Diagnosis not present

## 2019-12-25 DIAGNOSIS — G35 Multiple sclerosis: Secondary | ICD-10-CM | POA: Diagnosis not present

## 2019-12-27 DIAGNOSIS — M5412 Radiculopathy, cervical region: Secondary | ICD-10-CM | POA: Diagnosis not present

## 2019-12-27 DIAGNOSIS — M542 Cervicalgia: Secondary | ICD-10-CM | POA: Diagnosis not present

## 2020-01-03 ENCOUNTER — Other Ambulatory Visit: Payer: Self-pay | Admitting: Orthopedic Surgery

## 2020-01-03 DIAGNOSIS — M5412 Radiculopathy, cervical region: Secondary | ICD-10-CM

## 2020-01-22 ENCOUNTER — Ambulatory Visit
Admission: RE | Admit: 2020-01-22 | Discharge: 2020-01-22 | Disposition: A | Discharge status: Home / Self Care | Payer: Medicare Other | Source: Ambulatory Visit | Attending: Orthopedic Surgery | Admitting: Orthopedic Surgery

## 2020-01-22 ENCOUNTER — Other Ambulatory Visit: Payer: Self-pay

## 2020-01-22 DIAGNOSIS — M5412 Radiculopathy, cervical region: Secondary | ICD-10-CM

## 2020-01-22 DIAGNOSIS — M4802 Spinal stenosis, cervical region: Secondary | ICD-10-CM | POA: Diagnosis not present

## 2020-01-27 DIAGNOSIS — R0781 Pleurodynia: Secondary | ICD-10-CM | POA: Diagnosis not present

## 2020-01-27 DIAGNOSIS — S299XXA Unspecified injury of thorax, initial encounter: Secondary | ICD-10-CM | POA: Diagnosis not present

## 2020-01-28 DIAGNOSIS — M5412 Radiculopathy, cervical region: Secondary | ICD-10-CM | POA: Diagnosis not present

## 2020-02-05 DIAGNOSIS — R519 Headache, unspecified: Secondary | ICD-10-CM | POA: Diagnosis not present

## 2020-02-05 DIAGNOSIS — R9089 Other abnormal findings on diagnostic imaging of central nervous system: Secondary | ICD-10-CM | POA: Diagnosis not present

## 2020-02-05 DIAGNOSIS — G35 Multiple sclerosis: Secondary | ICD-10-CM | POA: Diagnosis not present

## 2020-02-05 DIAGNOSIS — E063 Autoimmune thyroiditis: Secondary | ICD-10-CM | POA: Diagnosis not present

## 2020-02-05 DIAGNOSIS — D72819 Decreased white blood cell count, unspecified: Secondary | ICD-10-CM | POA: Diagnosis not present

## 2020-02-12 ENCOUNTER — Other Ambulatory Visit: Payer: Self-pay

## 2020-02-12 ENCOUNTER — Ambulatory Visit
Admission: EM | Admit: 2020-02-12 | Discharge: 2020-02-12 | Disposition: A | Discharge status: Home / Self Care | Payer: Medicare Other | Attending: Emergency Medicine | Admitting: Emergency Medicine

## 2020-02-12 DIAGNOSIS — T63441A Toxic effect of venom of bees, accidental (unintentional), initial encounter: Secondary | ICD-10-CM | POA: Diagnosis not present

## 2020-02-12 DIAGNOSIS — R52 Pain, unspecified: Secondary | ICD-10-CM

## 2020-02-12 MED ORDER — DIPHENHYDRAMINE HCL 50 MG/ML IJ SOLN
12.5000 mg | Freq: Once | INTRAMUSCULAR | Status: AC
  Administered 2020-02-12: 12.5 mg via INTRAMUSCULAR

## 2020-02-12 MED ORDER — PREDNISONE 10 MG (21) PO TBPK
ORAL_TABLET | Freq: Every day | ORAL | 0 refills | Status: DC
Start: 2020-02-12 — End: 2020-05-26

## 2020-02-12 MED ORDER — DEXAMETHASONE SODIUM PHOSPHATE 10 MG/ML IJ SOLN
10.0000 mg | Freq: Once | INTRAMUSCULAR | Status: AC
  Administered 2020-02-12: 10 mg via INTRAMUSCULAR

## 2020-02-12 NOTE — ED Triage Notes (Signed)
Pt states stung by yellow jackets to arm, legs, back, and sides less 30 mins. Pt crying d/t pain all over. Pt is speaking in complete sentences. Pt denies swelling to throat or SOB.

## 2020-02-12 NOTE — Discharge Instructions (Signed)
Today you are treated for bee sting. It does take some time for this medication to take effect. Very important to go to ER if you develop worsening pain, rash, diarrhea, vomiting, fever, shortness of breath or chest pain.

## 2020-02-12 NOTE — ED Provider Notes (Signed)
[facility]    CSN: [id number] Arrival date & time: 02/12/20  1346      History   Chief Complaint Chief Complaint  Patient presents with  . Insect Bite    HPI Courtney Green is a 54 y.o. female presenting with her mother for evaluation of multiple bee stings.  Patient and mother both provide history: States this occurred 30 minutes PTA.  Patient dressing pain all over as she was "stunned all over ".  Denies throat swelling, difficulty breathing or swallowing, chest pain, vomiting, diarrhea.  Denies history of anaphylactic reaction or EpiPen use.  Has tried calamine without relief.    Past Medical History:  Diagnosis Date  . Allergy   . Anxiety   . Depression   . Fibromyalgia   . Headache(784.0)   . Insomnia   . Seizures ([place])   . Thyroid disease   . Thyroid disease     Patient Active Problem List   Diagnosis Date Noted  . Acquired hypothyroidism 11/16/2016  . Seasonal allergic rhinitis due to pollen 11/16/2016  . Anxiety and depression 11/16/2016  . Fibromyalgia 11/16/2016  . Psychophysiological insomnia 11/16/2016  . MS (multiple sclerosis) ([place]) 11/16/2016  . Frequent headaches 11/16/2016  . Seizures ([place]) 11/16/2016    Past Surgical History:  Procedure Laterality Date  . ABDOMINAL HYSTERECTOMY      OB History   No obstetric history on file.      Home Medications    Prior to Admission medications   Medication Sig Start Date End Date Taking? Authorizing Provider  ALPRAZolam (XANAX XR) 0.5 MG 24 hr tablet Take 1 tablet (0.5 mg total) by mouth every morning. 07/24/18   Isaac Bliss, Rayford Halsted, MD  DULoxetine (CYMBALTA) 60 MG capsule TAKE 1 CAPSULE (60 MG TOTAL) BY MOUTH AT BEDTIME. 07/19/19   Isaac Bliss, Rayford Halsted, MD  fluticasone [facility]) 50 MCG/ACT nasal spray Place 1 spray into both nostrils daily. 11/16/16   Jearld Fenton, NP  gabapentin (NEURONTIN) 300 MG capsule Take 300 mg by mouth 4 (four) times daily as needed.     [provider]  levothyroxine (SYNTHROID, LEVOTHROID) 88 MCG tablet Take 1 tablet (88 mcg total) by mouth daily. 07/25/18   Isaac Bliss, Rayford Halsted, MD  predniSONE (STERAPRED UNI-PAK 21 TAB) 10 MG (21) TBPK tablet Take by mouth daily. Take steroid taper as written 02/12/20   Hall-Potvin, [place], PA-C    Family History Family History  Problem Relation Age of Onset  . Birth defects Maternal Uncle        thyroid   . Diabetes Neg Hx     Social History Social History   Tobacco Use  . Smoking status: Former Smoker    Packs/day: 0.20    Types: Cigarettes    Quit date: 07/11/2013    Years since quitting: 6.5  . Smokeless tobacco: Never Used  Substance Use Topics  . Alcohol use: Yes    Comment: occassional  . Drug use: No     Allergies   Aspirin and Morphine and related   Review of Systems As per HPI   Physical Exam Triage Vital Signs ED Triage Vitals  Enc Vitals Group     BP 02/12/20 1358 136/67     Pulse Rate 02/12/20 1358 93     Resp 02/12/20 1358 18     Temp 02/12/20 1358 98.6 F (37 C)     Temp Source 02/12/20 1358 Oral     SpO2 02/12/20 1358  94 %     Weight --      Height --      Head Circumference --      Peak Flow --      Pain Score 02/12/20 1359 10     Pain Loc --      Pain Edu? --      Excl. in [place]? --    No data found.  Updated Vital Signs BP 136/67 (BP Location: Left Arm)   Pulse 93   Temp 98.6 F (37 C) (Oral)   Resp 18   SpO2 94%   Visual Acuity Right Eye Distance:   Left Eye Distance:   Bilateral Distance:    Right Eye Near:   Left Eye Near:    Bilateral Near:     Physical Exam Constitutional:      General: She is not in acute distress.    Comments: Crying, consolable  HENT:     Head: Normocephalic and atraumatic.     Mouth/Throat:     Mouth: Mucous membranes are moist.     Pharynx: Oropharynx is clear.  Eyes:     General: No scleral icterus.    Pupils: Pupils are equal, round, and reactive to light.    Cardiovascular:     Rate and Rhythm: Normal rate.  Pulmonary:     Effort: Pulmonary effort is normal.  Skin:    Coloration: Skin is not jaundiced or pale.     Comments: Localized bee stings without urticaria, welting, streaking.  Exam limited due to patient cooperation second to pain.  Neurological:     Mental Status: She is alert and oriented to person, place, and time.      UC Treatments / Results  Labs (all labs ordered are listed, but only abnormal results are displayed) Labs Reviewed - No data to display  EKG   Radiology No results found.  Procedures Procedures (including critical care time)  Medications Ordered in UC Medications - No data to display  Initial Impression / Assessment and Plan / UC Course  I have reviewed the triage vital signs and the nursing notes.  Pertinent labs & imaging results that were available during my care of the patient were reviewed by me and considered in my medical decision making (see chart for details).     Patient afebrile, nontoxic, and without airway obstruction at this time.  Patient given IM Solu-Medrol, Benadryl in office which she tolerated well.  Will provide prednisone given extent of stings.  Return precautions discussed, pt & mother verbalized understanding and are agreeable to plan. Final Clinical Impressions(s) / UC Diagnoses   Final diagnoses:  Bee sting reaction, accidental or unintentional, initial encounter  Pain     Discharge Instructions     Today you are treated for bee sting. It does take some time for this medication to take effect. Very important to go to ER if you develop worsening pain, rash, diarrhea, vomiting, fever, shortness of breath or chest pain.    ED Prescriptions    Medication Sig Dispense Auth. Provider   predniSONE (STERAPRED UNI-PAK 21 TAB) 10 MG (21) TBPK tablet Take by mouth daily. Take steroid taper as written 21 tablet Hall-Potvin, [place], PA-C     PDMP not reviewed this  encounter.   Hall-Potvin, [place], [place] 02/12/20 1410

## 2020-02-13 ENCOUNTER — Other Ambulatory Visit: Payer: Self-pay

## 2020-02-13 ENCOUNTER — Emergency Department (HOSPITAL_COMMUNITY)
Admission: EM | Admit: 2020-02-13 | Discharge: 2020-02-13 | Disposition: A | Discharge status: Home / Self Care | Payer: Medicare Other | Attending: Emergency Medicine | Admitting: Emergency Medicine

## 2020-02-13 ENCOUNTER — Encounter (HOSPITAL_COMMUNITY): Payer: Self-pay | Admitting: Emergency Medicine

## 2020-02-13 DIAGNOSIS — Z5321 Procedure and treatment not carried out due to patient leaving prior to being seen by health care provider: Secondary | ICD-10-CM | POA: Diagnosis not present

## 2020-02-13 DIAGNOSIS — T63441A Toxic effect of venom of bees, accidental (unintentional), initial encounter: Secondary | ICD-10-CM | POA: Diagnosis not present

## 2020-02-13 NOTE — ED Triage Notes (Signed)
Patient was sting by bees 12 hours ago. Patient went to urgent care and got medication. Patient has no hives or rash. Patient is complaining of pain still.

## 2020-03-23 DIAGNOSIS — M5412 Radiculopathy, cervical region: Secondary | ICD-10-CM | POA: Diagnosis not present

## 2020-04-06 DIAGNOSIS — R197 Diarrhea, unspecified: Secondary | ICD-10-CM | POA: Diagnosis not present

## 2020-04-06 DIAGNOSIS — R634 Abnormal weight loss: Secondary | ICD-10-CM | POA: Diagnosis not present

## 2020-04-06 DIAGNOSIS — R1030 Lower abdominal pain, unspecified: Secondary | ICD-10-CM | POA: Diagnosis not present

## 2020-04-08 DIAGNOSIS — R634 Abnormal weight loss: Secondary | ICD-10-CM | POA: Diagnosis not present

## 2020-04-08 DIAGNOSIS — R197 Diarrhea, unspecified: Secondary | ICD-10-CM | POA: Diagnosis not present

## 2020-04-08 DIAGNOSIS — R1030 Lower abdominal pain, unspecified: Secondary | ICD-10-CM | POA: Diagnosis not present

## 2020-04-14 DIAGNOSIS — R7301 Impaired fasting glucose: Secondary | ICD-10-CM | POA: Diagnosis not present

## 2020-04-14 DIAGNOSIS — E039 Hypothyroidism, unspecified: Secondary | ICD-10-CM | POA: Diagnosis not present

## 2020-04-14 DIAGNOSIS — E063 Autoimmune thyroiditis: Secondary | ICD-10-CM | POA: Diagnosis not present

## 2020-04-16 DIAGNOSIS — M5412 Radiculopathy, cervical region: Secondary | ICD-10-CM | POA: Diagnosis not present

## 2020-05-15 DIAGNOSIS — M792 Neuralgia and neuritis, unspecified: Secondary | ICD-10-CM | POA: Diagnosis not present

## 2020-05-15 DIAGNOSIS — R9089 Other abnormal findings on diagnostic imaging of central nervous system: Secondary | ICD-10-CM | POA: Diagnosis not present

## 2020-05-15 DIAGNOSIS — R519 Headache, unspecified: Secondary | ICD-10-CM | POA: Diagnosis not present

## 2020-05-15 DIAGNOSIS — G35 Multiple sclerosis: Secondary | ICD-10-CM | POA: Diagnosis not present

## 2020-05-26 ENCOUNTER — Ambulatory Visit
Admission: EM | Admit: 2020-05-26 | Discharge: 2020-05-26 | Disposition: A | Discharge status: Home / Self Care | Payer: Medicare Other | Attending: Family Medicine | Admitting: Family Medicine

## 2020-05-26 DIAGNOSIS — E039 Hypothyroidism, unspecified: Secondary | ICD-10-CM | POA: Diagnosis not present

## 2020-05-26 DIAGNOSIS — R7301 Impaired fasting glucose: Secondary | ICD-10-CM | POA: Diagnosis not present

## 2020-05-26 DIAGNOSIS — S61451A Open bite of right hand, initial encounter: Secondary | ICD-10-CM

## 2020-05-26 MED ORDER — HYDROCODONE-ACETAMINOPHEN 5-325 MG PO TABS: 1.0000 | ORAL_TABLET | Freq: Four times a day (QID) | ORAL | 0 refills | Status: DC | PRN

## 2020-05-26 MED ORDER — DOXYCYCLINE HYCLATE 100 MG PO TABS
100.0000 mg | ORAL_TABLET | Freq: Two times a day (BID) | ORAL | 0 refills | Status: DC
Start: 2020-05-26 — End: 2020-05-29

## 2020-05-26 NOTE — ED Triage Notes (Signed)
Pt states her neighbors dog bit her to her rt hand around 1530 today. States has multiple bites to 1st finger and a whole between 3rd-4th. States unable to get the bleeding to stop.

## 2020-05-26 NOTE — ED Provider Notes (Signed)
[facility]    CSN: [id number] Arrival date & time: 05/26/20  1734      History   Chief Complaint Chief Complaint  Patient presents with  . Animal Bite    HPI Courtney Green is a 54 y.o. female.   Established [facility] patient  Pt states her neighbors dog bit her to her rt hand around 1530 today. States has multiple bites to 1st finger and a whole between 3rd-4th. States unable to get the bleeding to stop.  Patient has had 3 months of unexplained diarrhea with 30 lb weight loss.  Evaluation underway.     Past Medical History:  Diagnosis Date  . Allergy   . Anxiety   . Depression   . Fibromyalgia   . Headache(784.0)   . Insomnia   . Seizures ([place])   . Thyroid disease   . Thyroid disease     Patient Active Problem List   Diagnosis Date Noted  . Acquired hypothyroidism 11/16/2016  . Seasonal allergic rhinitis due to pollen 11/16/2016  . Anxiety and depression 11/16/2016  . Fibromyalgia 11/16/2016  . Psychophysiological insomnia 11/16/2016  . MS (multiple sclerosis) ([place]) 11/16/2016  . Frequent headaches 11/16/2016  . Seizures ([place]) 11/16/2016    Past Surgical History:  Procedure Laterality Date  . ABDOMINAL HYSTERECTOMY      OB History   No obstetric history on file.      Home Medications    Prior to Admission medications   Medication Sig Start Date End Date Taking? Authorizing Provider  ALPRAZolam (XANAX XR) 0.5 MG 24 hr tablet Take 1 tablet (0.5 mg total) by mouth every morning. 07/24/18   Isaac Bliss, Rayford Halsted, MD  doxycycline (VIBRA-TABS) 100 MG tablet Take 1 tablet (100 mg total) by mouth 2 (two) times daily. 05/26/20   Robyn Haber, MD  DULoxetine (CYMBALTA) 60 MG capsule TAKE 1 CAPSULE (60 MG TOTAL) BY MOUTH AT BEDTIME. 07/19/19   Isaac Bliss, Rayford Halsted, MD  gabapentin (NEURONTIN) 300 MG capsule Take 300 mg by mouth 4 (four) times daily as needed.    [provider]  HYDROcodone-acetaminophen (NORCO) 5-325  MG tablet Take 1 tablet by mouth every 6 (six) hours as needed for moderate pain. 05/26/20   Robyn Haber, MD  levothyroxine (SYNTHROID, LEVOTHROID) 88 MCG tablet Take 1 tablet (88 mcg total) by mouth daily. 07/25/18   Erline Hau, MD    Family History Family History  Problem Relation Age of Onset  . Birth defects Maternal Uncle        thyroid   . Diabetes Neg Hx     Social History Social History   Tobacco Use  . Smoking status: Former Smoker    Packs/day: 0.20    Types: Cigarettes    Quit date: 07/11/2013    Years since quitting: 6.8  . Smokeless tobacco: Never Used  Substance Use Topics  . Alcohol use: Yes    Comment: occassional  . Drug use: No     Allergies   Butalbital-apap-caff-cod, Morphine and related, Pregabalin, Carbamazepine, and Aspirin   Review of Systems Review of Systems   Physical Exam Triage Vital Signs ED Triage Vitals [05/26/20 1748]  Enc Vitals Group     BP (!) 149/71     Pulse Rate 77     Resp 18     Temp 98.5 F (36.9 C)     Temp Source Oral     SpO2 95 %     Weight  Height      Head Circumference      Peak Flow      Pain Score 7     Pain Loc      Pain Edu?      Excl. in [place]?    No data found.  Updated Vital Signs BP (!) 149/71 (BP Location: Left Arm)   Pulse 77   Temp 98.5 F (36.9 C) (Oral)   Resp 18   SpO2 95%    Physical Exam Vitals and nursing note reviewed.  Constitutional:      Appearance: Normal appearance. She is normal weight.  Musculoskeletal:        General: Normal range of motion.     Cervical back: Normal range of motion and neck supple.  Skin:    General: Skin is warm.     Findings: Lesion present.     Comments: Multiple small 2-3 mm lacerations on all fingers with one 5 mm laceration in webspace of middle and ring fingers which was glued.  Neurological:     General: No focal deficit present.     Mental Status: She is alert.  Psychiatric:        Mood and Affect: Mood normal.       UC Treatments / Results  Labs (all labs ordered are listed, but only abnormal results are displayed) Labs Reviewed - No data to display  EKG   Radiology No results found.  Procedures Procedures (including critical care time)  Medications Ordered in UC Medications - No data to display  Initial Impression / Assessment and Plan / UC Course  I have reviewed the triage vital signs and the nursing notes.  Pertinent labs & imaging results that were available during my care of the patient were reviewed by me and considered in my medical decision making (see chart for details).    Final Clinical Impressions(s) / UC Diagnoses   Final diagnoses:  Animal bite of right hand, initial encounter   Discharge Instructions   None    ED Prescriptions    Medication Sig Dispense Auth. Provider   doxycycline (VIBRA-TABS) 100 MG tablet Take 1 tablet (100 mg total) by mouth 2 (two) times daily. 20 tablet Robyn Haber, MD   HYDROcodone-acetaminophen (NORCO) 5-325 MG tablet Take 1 tablet by mouth every 6 (six) hours as needed for moderate pain. 12 tablet Robyn Haber, MD     I have reviewed the PDMP during this encounter.   Robyn Haber, MD 05/26/20 [place]

## 2020-05-29 ENCOUNTER — Telehealth: Payer: Self-pay

## 2020-05-29 MED ORDER — AMOXICILLIN-POT CLAVULANATE 875-125 MG PO TABS
1.0000 | ORAL_TABLET | Freq: Two times a day (BID) | ORAL | 0 refills | Status: DC
Start: 2020-05-29 — End: 2020-06-09

## 2020-05-30 NOTE — Progress Notes (Signed)
New patient - records from [facility], Dr. Janie Morning.

## 2020-06-09 ENCOUNTER — Ambulatory Visit: Payer: Medicare Other | Admitting: Gastroenterology

## 2020-06-09 ENCOUNTER — Other Ambulatory Visit (INDEPENDENT_AMBULATORY_CARE_PROVIDER_SITE_OTHER): Payer: Medicare Other

## 2020-06-09 ENCOUNTER — Encounter: Payer: Self-pay | Admitting: Gastroenterology

## 2020-06-09 VITALS — BP 118/72 | HR 78 | Ht 64.0 in | Wt 133.4 lb

## 2020-06-09 DIAGNOSIS — R1030 Lower abdominal pain, unspecified: Secondary | ICD-10-CM

## 2020-06-09 DIAGNOSIS — R634 Abnormal weight loss: Secondary | ICD-10-CM | POA: Diagnosis not present

## 2020-06-09 DIAGNOSIS — R159 Full incontinence of feces: Secondary | ICD-10-CM | POA: Diagnosis not present

## 2020-06-09 DIAGNOSIS — K529 Noninfective gastroenteritis and colitis, unspecified: Secondary | ICD-10-CM

## 2020-06-09 LAB — CBC WITH DIFFERENTIAL/PLATELET
Basophils Absolute: 0 10*3/uL (ref 0.0–0.1)
Basophils Relative: 0.8 % (ref 0.0–3.0)
Eosinophils Absolute: 0.2 10*3/uL (ref 0.0–0.7)
Eosinophils Relative: 4 % (ref 0.0–5.0)
HCT: 37.7 % (ref 36.0–46.0)
Hemoglobin: 13 g/dL (ref 12.0–15.0)
Lymphocytes Relative: 22.6 % (ref 12.0–46.0)
Lymphs Abs: 1.1 10*3/uL (ref 0.7–4.0)
MCHC: 34.4 g/dL (ref 30.0–36.0)
MCV: 91.8 fl (ref 78.0–100.0)
Monocytes Absolute: 0.4 10*3/uL (ref 0.1–1.0)
Monocytes Relative: 7.3 % (ref 3.0–12.0)
Neutro Abs: 3.3 10*3/uL (ref 1.4–7.7)
Neutrophils Relative %: 65.3 % (ref 43.0–77.0)
Platelets: 315 10*3/uL (ref 150.0–400.0)
RBC: 4.11 Mil/uL (ref 3.87–5.11)
RDW: 13.2 % (ref 11.5–15.5)
WBC: 5 10*3/uL (ref 4.0–10.5)

## 2020-06-09 LAB — HEPATIC FUNCTION PANEL
ALT: 10 U/L (ref 0–35)
AST: 14 U/L (ref 0–37)
Albumin: 4.6 g/dL (ref 3.5–5.2)
Alkaline Phosphatase: 49 U/L (ref 39–117)
Bilirubin, Direct: 0.1 mg/dL (ref 0.0–0.3)
Total Bilirubin: 0.3 mg/dL (ref 0.2–1.2)
Total Protein: 7.3 g/dL (ref 6.0–8.3)

## 2020-06-09 LAB — LIPASE: Lipase: 19 U/L (ref 11.0–59.0)

## 2020-06-09 LAB — C-REACTIVE PROTEIN: CRP: <1 mg/dL (ref 0.5–20.0)

## 2020-06-09 MED ORDER — IBGARD 90 MG PO CPCR: ORAL_CAPSULE | ORAL | 0 refills | Status: DC

## 2020-06-09 MED ORDER — SUTAB 1479-225-188 MG PO TABS: 1.0000 | ORAL_TABLET | Freq: Once | ORAL | 0 refills | Status: AC

## 2020-06-09 NOTE — Patient Instructions (Addendum)
If you are age 55 or older, your body mass index should be between 23-30. Your Body mass index is 22.89 kg/m. If this is out of the aforementioned range listed, please consider follow up with your Primary Care Provider.  If you are age 52 or younger, your body mass index should be between 19-25. Your Body mass index is 22.89 kg/m. If this is out of the aformentioned range listed, please consider follow up with your Primary Care Provider.   You have been scheduled for a colonoscopy. Please follow written instructions given to you at your visit today.  Please pick up your prep supplies at the pharmacy within the next 1-3 days. If you use inhalers (even only as needed), please bring them with you on the day of your procedure.  Please go to the lab in the basement of our building to have lab work done as you leave today. Hit "B" for basement when you get on the elevator.  When the doors open the lab is on your left.  We will call you with the results. Thank you.  Due to recent changes in healthcare laws, you may see the results of your imaging and laboratory studies on MyChart before your provider has had a chance to review them.  We understand that in some cases there may be results that are confusing or concerning to you. Not all laboratory results come back in the same time frame and the provider may be waiting for multiple results in order to interpret others.  Please give [place] 48 hours in order for your provider to thoroughly review all the results before contacting the office for clarification of your results.   We have given you samples of the following medication to take: IBgard: Take as directed  Continue Imodium  Thank you for entrusting me with your care and for choosing [facility], Dr. Haughton Cellar

## 2020-06-09 NOTE — Progress Notes (Signed)
HPI :  54 year old female with a history of multiple sclerosis, chronic diarrhea, seizure disorder, fibromyalgia, referred by Janie Morning DO for chronic diarrhea.   Patient states she has had intermittent lower abdominal pains for years.  At first she thought this was due to her uterus and had a hysterectomy years ago but this did not really resolve her symptoms.  She states in 2014 timeframe she developed some diarrhea with this, previously her bowels were okay.  She reports having a history of a colonoscopy with Dr. Collene Mares in 2014, she had 5 polyps removed, 2 were reportedly precancerous.  She is not aware of any biopsies that were done for her colon at that time.  She states eventually her bowels had normalized but then she has had recurrent diarrhea this past year, particularly since August this is really bothered her.  She has loose stools on a daily basis, anywhere from 1-10 times per day.  She has occasional nocturnal symptoms, has had fecal incontinence at times due to urgency.  She states the abdominal cramping continues and comes and goes, feels in her lower abdomen diffusely.  Does not really get reliable relief with a bowel movement.  She denies any nausea or vomiting.  No reflux symptoms.  She is concerned that when she eats "regular food "this can lead to worsening pain and diarrhea with urgency.  She is been on a very bland diet to minimize her symptoms, though she remains largely symptomatic despite this.  She has been using Imodium which does help reduce her stool frequency.  She had used Pepto-Bismol but this made her stool dark so she stopped it.  She denies any NSAID use.  She was given some antibiotic for dog bite month ago but her diarrhea preceded this by several months.  She was given empiric trial of Flagyl which did not put any benefit.  She tried Bentyl for the spasms but this caused itching and she stopped it.  She tried Levsin but this caused worsening diarrhea.  She does take  ocrelizumab for MS, she also takes Viibryd and Cymbalta.  States her grandmother had gastric cancer, no reported history of IBD or colon cancer in the family.  She has become increasingly frustrated with persistent symptoms and lack of relief.  He had a negative stool study for C. difficile in September.  At that time she had a white blood cell count of 8 with a hemoglobin of 11.9.  She incidentally was noted to have an ALT of 131, AST of 12, alk phos 136.  She denies any history of liver problems.  She is concerned she has lost about 30 pounds since August due to these ongoing symptoms.   MRI c spine 01/22/20 - IMPRESSION: 1. Progressed chronic disc and endplate degeneration at C4-C5 and C5-C6 since the 2017 MRI with increased mild spinal stenosis and mild cord mass effect. Mild to moderate right C6 neural foraminal stenosis appears increased.  2. Stable C6-C7 degeneration with mild spinal stenosis and severe right C7 foraminal stenosis.  3. Spinal cord signal remains normal, no evidence of cervical cord Demyelination.   Labs: 04/06/20 -  WBC 8.2, Hgb 11.9, PLT 267, MCV 94.9  ALT 131, AST 12, AP 136, T bil 0.3 BUN 10, Cr 0.72  C diff (-) toxin A /B   Colonoscopy 2014 - reportedly 2 pre-cancerous polyps removed, no report available - Dr. Collene Mares   Past Medical History:  Diagnosis Date  . Allergy   . Anemia   .  Anxiety   . Depression   . Fibromyalgia   . Headache(784.0)   . History of colon polyps   . Insomnia   . Multiple sclerosis, relapsing-remitting ([place]) 2016   Dr. Dellis Filbert [facility]  . Seizures ([place])   . Thyroid disease    Hashimoto's thyroiditis 10-2018     Past Surgical History:  Procedure Laterality Date  . ABDOMINAL HYSTERECTOMY    . COLONOSCOPY  2014   5 polyps 2 was precanerous. Dr Collene Mares  . KNEE ARTHROSCOPY Left    Family History  Problem Relation Age of Onset  . Diabetes Mother   . Stomach cancer Maternal Grandmother   . Birth defects Maternal  Uncle        thyroid    Social History   Tobacco Use  . Smoking status: Former Smoker    Packs/day: 0.20    Types: Cigarettes    Quit date: 07/11/2013    Years since quitting: 6.9  . Smokeless tobacco: Never Used  Vaping Use  . Vaping Use: Every day  Substance Use Topics  . Alcohol use: Yes    Comment: occassional  . Drug use: No   Current Outpatient Medications  Medication Sig Dispense Refill  . acetaminophen-codeine (TYLENOL #3) 300-30 MG tablet Take by mouth every 4 (four) hours as needed for moderate pain.    Marland Kitchen amphetamine-dextroamphetamine (ADDERALL) 10 MG tablet Take 10 mg by mouth daily as needed.    . baclofen (LIORESAL) 10 MG tablet Take 10 mg by mouth 3 (three) times daily as needed for muscle spasms.    . carbamazepine (TEGRETOL) 200 MG tablet Take 200 mg by mouth in the morning and at bedtime.    . clonazePAM (KLONOPIN) 1 MG tablet Take 1 mg by mouth 2 (two) times daily as needed.    . DULoxetine (CYMBALTA) 60 MG capsule TAKE 1 CAPSULE (60 MG TOTAL) BY MOUTH AT BEDTIME. 90 capsule 0  . gabapentin (NEURONTIN) 300 MG capsule Take 300 mg by mouth 4 (four) times daily as needed.    Marland Kitchen levothyroxine (SYNTHROID) 112 MCG tablet Take 112 mcg by mouth daily before breakfast.    . loperamide (IMODIUM) 2 MG capsule Take by mouth as needed for diarrhea or loose stools.    . montelukast (SINGULAIR) 10 MG tablet Take 10 mg by mouth daily as needed.    Marland Kitchen ocrelizumab 600 mg in sodium chloride 0.9 % 500 mL Inject 600 mg into the vein. Twice a year    . QUEtiapine (SEROQUEL) 100 MG tablet Take 100 mg by mouth at bedtime.    . Vilazodone HCl (VIIBRYD) 40 MG TABS 40 mg at bedtime.     Marland Kitchen Peppermint Oil (IBGARD) 90 MG CPCR Take as directed 4 capsule 0  . Sodium Sulfate-Mag Sulfate-KCl (SUTAB) [phone] MG TABS Take 1 kit by mouth once for 1 dose. Marland KitchenMANUFACTURER CODES!! BIN: [id number] PCN: CN GROUP: [id number] MEMBER ID: [id number];VEH AS SECONDARY INSURANCE ;NO PRIOR AUTHORIZATION 24 tablet 0    No current facility-administered medications for this visit.   Allergies  Allergen Reactions  . Butalbital-Apap-Caff-Cod Itching  . Morphine And Related Itching  . Pregabalin Itching  . Carbamazepine Rash  . Bentyl [Dicyclomine]     itching  . Depakote [Divalproex Sodium]     Hair loss  . Aspirin Nausea Only  . Doxycycline Itching and Rash     Review of Systems: All systems reviewed and negative except where noted in HPI.   See HPI for labs  Physical Exam: BP 118/72   Pulse 78   Ht '5\' 4"'  (1.626 m)   Wt 133 lb 6 oz (60.5 kg)   BMI 22.89 kg/m  Constitutional: Pleasant,well-developed, female in no acute distress. HEENT: Normocephalic and atraumatic. Conjunctivae are normal. No scleral icterus. Neck supple.  Cardiovascular: Normal rate, regular rhythm.  Pulmonary/chest: Effort normal and breath sounds normal. No wheezing, rales or rhonchi. Abdominal: Soft, nondistended, mild lower abdominal TTP, no peritoneal signs.  There are no masses palpable.  Extremities: no edema Lymphadenopathy: No cervical adenopathy noted. Neurological: Alert and oriented to person place and time. Skin: Skin is warm and dry. No rashes noted. Psychiatric: Normal mood and affect. Behavior is normal.   ASSESSMENT AND PLAN: 54 year old female here for new patient assessment of the following:  Chronic diarrhea Fecal incontinence Lower abdominal pain Weight loss Elevated liver enzymes  Discussed differential diagnosis with her.  Infectious seems unlikely given chronicity of her symptoms.  She tested negative for C. Difficile in September and her labs at that time looked okay. Will repeat CBC to ensure no anemia, repeat LFTs to see if there is persistent elevation, check CRP, and screen for celiac disease with serology.  Ultimately given her severe diarrhea, nocturnal symptoms with incontinence at times, am recommending colonoscopy to exclude IBD and rule out microscopic colitis.  I discussed  colonoscopy and anesthesia with her, risk benefits of the exam and anesthesia, she wanted to proceed with it.  Discussed options in the interim while we are waiting this exam.  Imodium is helping her and recommend she continue that for now.  Gave her some free samples of IBgard which is safe and we will see if that will help some of her cramping given intolerance to bentyl / levsin thus far.  Will await the colonoscopy first and her lab work-up with further recommendations.  If this is negative and her symptoms persist including abdominal pain will need cross-sectional imaging with CT scan.  She agreed with the plan will contact me in the interim with any change in her status.  [place] Cellar, MD [facility]  CC: Janie Morning, DO

## 2020-06-10 DIAGNOSIS — G35 Multiple sclerosis: Secondary | ICD-10-CM | POA: Diagnosis not present

## 2020-06-10 LAB — IGA: Immunoglobulin A: 114 mg/dL (ref 47–310)

## 2020-06-10 LAB — TISSUE TRANSGLUTAMINASE, IGA: (tTG) Ab, IgA: <1 U/mL

## 2020-07-13 ENCOUNTER — Encounter: Payer: Medicare Other | Admitting: Gastroenterology

## 2020-07-14 ENCOUNTER — Encounter: Payer: Medicare Other | Admitting: Gastroenterology

## 2020-07-24 ENCOUNTER — Other Ambulatory Visit: Payer: Self-pay

## 2020-07-24 ENCOUNTER — Encounter: Payer: Self-pay | Admitting: Gastroenterology

## 2020-07-24 ENCOUNTER — Ambulatory Visit (AMBULATORY_SURGERY_CENTER): Payer: Medicare Other | Admitting: Gastroenterology

## 2020-07-24 VITALS — BP 121/76 | HR 69 | Temp 97.1°F | Resp 12 | Ht 64.0 in | Wt 133.0 lb

## 2020-07-24 DIAGNOSIS — K529 Noninfective gastroenteritis and colitis, unspecified: Secondary | ICD-10-CM | POA: Diagnosis not present

## 2020-07-24 DIAGNOSIS — R197 Diarrhea, unspecified: Secondary | ICD-10-CM | POA: Diagnosis not present

## 2020-07-24 DIAGNOSIS — D123 Benign neoplasm of transverse colon: Secondary | ICD-10-CM | POA: Diagnosis not present

## 2020-07-24 DIAGNOSIS — R159 Full incontinence of feces: Secondary | ICD-10-CM | POA: Diagnosis not present

## 2020-07-24 DIAGNOSIS — D128 Benign neoplasm of rectum: Secondary | ICD-10-CM

## 2020-07-24 DIAGNOSIS — K6289 Other specified diseases of anus and rectum: Secondary | ICD-10-CM | POA: Diagnosis not present

## 2020-07-24 DIAGNOSIS — K648 Other hemorrhoids: Secondary | ICD-10-CM

## 2020-07-24 DIAGNOSIS — R1030 Lower abdominal pain, unspecified: Secondary | ICD-10-CM

## 2020-07-24 MED ORDER — SODIUM CHLORIDE 0.9 % IV SOLN
500.0000 mL | INTRAVENOUS | Status: DC
Start: 2020-07-24 — End: 2020-07-24

## 2020-07-24 NOTE — Progress Notes (Signed)
Called to room to assist during endoscopic procedure.  Patient ID and intended procedure confirmed with present staff. Received instructions for my participation in the procedure from the performing physician.  

## 2020-07-24 NOTE — Patient Instructions (Signed)
Handouts Provided:  Polyps  YOU HAD AN ENDOSCOPIC PROCEDURE TODAY AT THE [facility]:   Refer to the procedure report that was given to you for any specific questions about what was found during the examination.  If the procedure report does not answer your questions, please call your gastroenterologist to clarify.  If you requested that your care partner not be given the details of your procedure findings, then the procedure report has been included in a sealed envelope for you to review at your convenience later.  YOU SHOULD EXPECT: Some feelings of bloating in the abdomen. Passage of more gas than usual.  Walking can help get rid of the air that was put into your GI tract during the procedure and reduce the bloating. If you had a lower endoscopy (such as a colonoscopy or flexible sigmoidoscopy) you may notice spotting of blood in your stool or on the toilet paper. If you underwent a bowel prep for your procedure, you may not have a normal bowel movement for a few days.  Please Note:  You might notice some irritation and congestion in your nose or some drainage.  This is from the oxygen used during your procedure.  There is no need for concern and it should clear up in a day or so.  SYMPTOMS TO REPORT IMMEDIATELY:   Following lower endoscopy (colonoscopy or flexible sigmoidoscopy):  Excessive amounts of blood in the stool  Significant tenderness or worsening of abdominal pains  Swelling of the abdomen that is new, acute  Fever of 100F or higher  For urgent or emergent issues, a gastroenterologist can be reached at any hour by calling [phone]. Do not use MyChart messaging for urgent concerns.    DIET:  We do recommend a small meal at first, but then you may proceed to your regular diet.  Drink plenty of fluids but you should avoid alcoholic beverages for 24 hours.  ACTIVITY:  You should plan to take it easy for the rest of today and you should NOT DRIVE or use heavy  machinery until tomorrow (because of the sedation medicines used during the test).    FOLLOW UP: Our staff will call the number listed on your records 48-72 hours following your procedure to check on you and address any questions or concerns that you may have regarding the information given to you following your procedure. If we do not reach you, we will leave a message.  We will attempt to reach you two times.  During this call, we will ask if you have developed any symptoms of COVID 19. If you develop any symptoms (ie: fever, flu-like symptoms, shortness of breath, cough etc.) before then, please call [phone].  If you test positive for Covid 19 in the 2 weeks post procedure, please call and report this information to us.    If any biopsies were taken you will be contacted by phone or by letter within the next 1-3 weeks.  Please call us at [phone] if you have not heard about the biopsies in 3 weeks.    SIGNATURES/CONFIDENTIALITY: You and/or your care partner have signed paperwork which will be entered into your electronic medical record.  These signatures attest to the fact that that the information above on your After Visit Summary has been reviewed and is understood.  Full responsibility of the confidentiality of this discharge information lies with you and/or your care-partner.  

## 2020-07-24 NOTE — Op Note (Signed)
[facility] Patient Name: Courtney Green Procedure Date: 07/24/2020 10:07 AM MRN: [id number] Endoscopist: Remo Lipps P. Havery Moros , MD Age: 55 Referring MD:  Date of Birth: 03-17-1966 Gender: Female Account #: [id number] Procedure:                Colonoscopy Indications:              Chronic diarrhea, Fecal incontinence Medicines:                Monitored Anesthesia Care Procedure:                Pre-Anesthesia Assessment:                           - Prior to the procedure, a History and Physical                            was performed, and patient medications and                            allergies were reviewed. The patient's tolerance of                            previous anesthesia was also reviewed. The risks                            and benefits of the procedure and the sedation                            options and risks were discussed with the patient.                            All questions were answered, and informed consent                            was obtained. Prior Anticoagulants: The patient has                            taken no previous anticoagulant or antiplatelet                            agents. ASA Grade Assessment: II - A patient with                            mild systemic disease. After reviewing the risks                            and benefits, the patient was deemed in                            satisfactory condition to undergo the procedure.                           After obtaining informed consent, the colonoscope  was passed under direct vision. Throughout the                            procedure, the patient's blood pressure, pulse, and                            oxygen saturations were monitored continuously. The                            Olympus CF-[id number] [id number]) Colonoscope was                            introduced through the anus and advanced to the the                            terminal ileum,  with identification of the                            appendiceal orifice and IC valve. The colonoscopy                            was performed without difficulty. The patient                            tolerated the procedure well. The quality of the                            bowel preparation was good. The terminal ileum,                            ileocecal valve, appendiceal orifice, and rectum                            were photographed. Scope In: 10:17:21 AM Scope Out: 10:39:20 AM Scope Withdrawal Time: 0 hours 16 minutes 59 seconds  Total Procedure Duration: 0 hours 21 minutes 59 seconds  Findings:                 The perianal and digital rectal examinations were                            normal.                           The terminal ileum appeared normal.                           Two sessile polyps were found in the transverse                            colon. The polyps were 3 mm in size. These polyps                            were removed with a cold biopsy forceps. Resection  and retrieval were complete.                           A 8 mm polyp was found in the rectum. The polyp was                            sessile. The polyp was removed with a cold snare.                            Resection and retrieval were complete.                           Anal papilla(e) were hypertrophied. Biopsies were                            taken with a cold forceps for histology to rule out                            AIN.                           Internal hemorrhoids were found during retroflexion.                           The exam was otherwise without abnormality.                           Biopsies for histology were taken with a cold                            forceps from the right colon, left colon and                            transverse colon for evaluation of microscopic                            colitis. Complications:            No immediate  complications. Estimated blood loss:                            Minimal.. Estimated Blood Loss:     Estimated blood loss was minimal. Impression:               - The examined portion of the ileum was normal.                           - Two 3 mm polyps in the transverse colon, removed                            with a cold biopsy forceps. Resected and retrieved.                           - One 8 mm polyp in the rectum, removed with a cold  snare. Resected and retrieved.                           - Anal papilla(e) were hypertrophied. Biopsied to                            rule out AIN.                           - Internal hemorrhoids.                           - The examination was otherwise normal.                           - Biopsies were taken with a cold forceps from the                            right colon, left colon and transverse colon for                            evaluation of microscopic colitis. Recommendation:           - Patient has a contact number available for                            emergencies. The signs and symptoms of potential                            delayed complications were discussed with the                            patient. Return to normal activities tomorrow.                            Written discharge instructions were provided to the                            patient.                           - Resume previous diet.                           - Continue present medications.                           - Await pathology results with further                            recommendations. Remo Lipps P. Jezlyn Westerfield, MD 07/24/2020 10:46:14 AM This report has been signed electronically.

## 2020-07-24 NOTE — Progress Notes (Signed)
PT taken to PACU. Monitors in place. VSS. Report given to RN. 

## 2020-07-28 ENCOUNTER — Telehealth: Payer: Self-pay

## 2020-07-28 NOTE — Telephone Encounter (Signed)
Covid-19 screening questions   Do you now or have you had a fever in the last 14 days? No.  Do you have any respiratory symptoms of shortness of breath or cough now or in the last 14 days? No. Do you have any family members or close contacts with diagnosed or suspected Covid-19 in the past 14 days? No.  Have you been tested for Covid-19 and found to be positive? No.       Follow up Call-  Call back number 07/24/2020  Post procedure Call Back phone  # [phone]  Permission to leave phone message Yes  Some recent data might be hidden     Patient questions:  Do you have a fever, pain , or abdominal swelling? Yes.   Pain Score  7 *  Have you tolerated food without any problems? No.  Have you been able to return to your normal activities? Yes.    Do you have any questions about your discharge instructions: Diet   Yes.   Medications  No. Follow up visit  No.  Do you have questions or concerns about your Care? Yes.  Pt. Reports she was able to eat post procedure, but last night was in terrible pain with cramps in her left side after dinner, and had severe diarrhea.  (Pt. Reports this is all in line with how she has been symptomatically since August.).  Pt. Says she has pain in her left side and hip chronically, and is frequently intolerant of most food options.   Told pt. We were waiting for results from her biopsies, and would be in touch wit the results.  Pt. Wondered if the pain could be from her hemorrhoids.  Lengthy discussion re: hemorrhoids not responsible for these symptoms, and that we will do all we can to pursue assisting with her GI symptoms.  Actions: * If pain score is 4 or above: Physician/ provider Notified : Carlota Raspberry. Havery Moros, MD.

## 2020-07-30 ENCOUNTER — Other Ambulatory Visit: Payer: Self-pay

## 2020-07-30 DIAGNOSIS — K529 Noninfective gastroenteritis and colitis, unspecified: Secondary | ICD-10-CM

## 2020-07-30 DIAGNOSIS — R1030 Lower abdominal pain, unspecified: Secondary | ICD-10-CM

## 2020-07-31 ENCOUNTER — Other Ambulatory Visit: Payer: Self-pay

## 2020-08-04 ENCOUNTER — Other Ambulatory Visit: Payer: Self-pay

## 2020-08-04 ENCOUNTER — Ambulatory Visit (HOSPITAL_COMMUNITY)
Admission: RE | Admit: 2020-08-04 | Discharge: 2020-08-04 | Disposition: A | Discharge status: Home / Self Care | Payer: Medicare Other | Source: Ambulatory Visit | Attending: Gastroenterology | Admitting: Gastroenterology

## 2020-08-04 ENCOUNTER — Encounter (HOSPITAL_COMMUNITY): Payer: Self-pay

## 2020-08-04 DIAGNOSIS — S92512A Displaced fracture of proximal phalanx of left lesser toe(s), initial encounter for closed fracture: Secondary | ICD-10-CM | POA: Diagnosis not present

## 2020-08-04 DIAGNOSIS — R197 Diarrhea, unspecified: Secondary | ICD-10-CM | POA: Diagnosis not present

## 2020-08-04 DIAGNOSIS — R1030 Lower abdominal pain, unspecified: Secondary | ICD-10-CM | POA: Diagnosis not present

## 2020-08-04 DIAGNOSIS — K529 Noninfective gastroenteritis and colitis, unspecified: Secondary | ICD-10-CM | POA: Diagnosis not present

## 2020-08-04 DIAGNOSIS — K7689 Other specified diseases of liver: Secondary | ICD-10-CM | POA: Diagnosis not present

## 2020-08-04 DIAGNOSIS — M79675 Pain in left toe(s): Secondary | ICD-10-CM | POA: Diagnosis not present

## 2020-08-04 MED ORDER — IOHEXOL 300 MG/ML  SOLN
100.0000 mL | Freq: Once | INTRAMUSCULAR | Status: AC | PRN
  Administered 2020-08-04: 100 mL via INTRAVENOUS

## 2020-08-06 ENCOUNTER — Telehealth: Payer: Self-pay | Admitting: Gastroenterology

## 2020-08-06 ENCOUNTER — Other Ambulatory Visit: Payer: Self-pay

## 2020-08-06 MED ORDER — COLESTIPOL HCL 1 G PO TABS: ORAL_TABLET | ORAL | 0 refills | Status: DC

## 2020-08-06 NOTE — Telephone Encounter (Signed)
Spoke with patient, see 08/04/20 CT scan result note for more information.

## 2020-08-06 NOTE — Telephone Encounter (Signed)
Pt is returning a missed call regarding her CT scan results. 

## 2020-08-10 DIAGNOSIS — R531 Weakness: Secondary | ICD-10-CM | POA: Diagnosis not present

## 2020-08-10 DIAGNOSIS — R202 Paresthesia of skin: Secondary | ICD-10-CM | POA: Diagnosis not present

## 2020-08-10 DIAGNOSIS — R519 Headache, unspecified: Secondary | ICD-10-CM | POA: Diagnosis not present

## 2020-08-10 DIAGNOSIS — R9089 Other abnormal findings on diagnostic imaging of central nervous system: Secondary | ICD-10-CM | POA: Diagnosis not present

## 2020-08-10 DIAGNOSIS — M792 Neuralgia and neuritis, unspecified: Secondary | ICD-10-CM | POA: Diagnosis not present

## 2020-08-10 DIAGNOSIS — F32A Depression, unspecified: Secondary | ICD-10-CM | POA: Diagnosis not present

## 2020-08-10 DIAGNOSIS — G35 Multiple sclerosis: Secondary | ICD-10-CM | POA: Diagnosis not present

## 2020-08-10 DIAGNOSIS — D72819 Decreased white blood cell count, unspecified: Secondary | ICD-10-CM | POA: Diagnosis not present

## 2020-08-10 DIAGNOSIS — R252 Cramp and spasm: Secondary | ICD-10-CM | POA: Diagnosis not present

## 2020-08-12 ENCOUNTER — Ambulatory Visit (INDEPENDENT_AMBULATORY_CARE_PROVIDER_SITE_OTHER): Payer: Medicare Other

## 2020-08-12 ENCOUNTER — Encounter: Payer: Self-pay | Admitting: Podiatry

## 2020-08-12 ENCOUNTER — Other Ambulatory Visit: Payer: Self-pay

## 2020-08-12 ENCOUNTER — Ambulatory Visit: Payer: Medicare Other | Admitting: Podiatry

## 2020-08-12 DIAGNOSIS — M79672 Pain in left foot: Secondary | ICD-10-CM

## 2020-08-12 DIAGNOSIS — S92515A Nondisplaced fracture of proximal phalanx of left lesser toe(s), initial encounter for closed fracture: Secondary | ICD-10-CM

## 2020-08-12 DIAGNOSIS — M79671 Pain in right foot: Secondary | ICD-10-CM

## 2020-08-12 NOTE — Progress Notes (Signed)
Subjective:   Patient ID: Courtney Green, female   DOB: 55 y.o.   MRN: [id number]   HPI Patient presents stating she traumatized her left foot and she may have fractured the toe she is wearing an open toed shoe still having pain and concerned about the discomfort she still experiences.  Patient does not smoke currently does take infusions for history of MS and does try to stay active   Review of Systems  All other systems reviewed and are negative.       Objective:  Physical Exam Vitals and nursing note reviewed.  Constitutional:      Appearance: She is well-developed and well-nourished.  Cardiovascular:     Pulses: Intact distal pulses.  Pulmonary:     Effort: Pulmonary effort is normal.  Musculoskeletal:        General: Normal range of motion.  Skin:    General: Skin is warm.  Neurological:     Mental Status: She is alert.     Neurovascular status found to be intact muscle strength was found to be adequate range of motion adequate.  Patient has forefoot swelling left with quite a bit of pain around the base of the fifth digit left medial side with soreness upon palpation.  Patient is found to have good digital perfusion well oriented x3     Assessment:  Probability for fracture of the fifth digit left base of the proximal phalanx     Plan:  H&P reviewed condition and at this point we will continue immobilization for another 4 weeks and it should heal uneventfully and if it does not may have to consider bone resection.  Reappoint to recheck  X-rays were negative for major injury with what appears to be a small fracture base of fifth digit medial side left foot

## 2020-08-14 ENCOUNTER — Ambulatory Visit: Payer: Medicare Other | Admitting: Gastroenterology

## 2020-08-27 MED ORDER — COLESTIPOL HCL 1 G PO TABS: ORAL_TABLET | ORAL | 2 refills | Status: DC

## 2020-08-27 NOTE — Telephone Encounter (Signed)
Prescription refill has been sent in for Colestipol.

## 2020-08-29 ENCOUNTER — Other Ambulatory Visit: Payer: Self-pay | Admitting: Gastroenterology

## 2020-09-18 ENCOUNTER — Other Ambulatory Visit: Payer: Self-pay | Admitting: Gastroenterology

## 2020-10-04 ENCOUNTER — Other Ambulatory Visit: Payer: Self-pay | Admitting: Gastroenterology

## 2020-10-13 DIAGNOSIS — E039 Hypothyroidism, unspecified: Secondary | ICD-10-CM | POA: Diagnosis not present

## 2020-10-13 DIAGNOSIS — R7301 Impaired fasting glucose: Secondary | ICD-10-CM | POA: Diagnosis not present

## 2020-10-13 DIAGNOSIS — Z0000 Encounter for general adult medical examination without abnormal findings: Secondary | ICD-10-CM | POA: Diagnosis not present

## 2020-10-13 DIAGNOSIS — G35 Multiple sclerosis: Secondary | ICD-10-CM | POA: Diagnosis not present

## 2020-11-05 DIAGNOSIS — G35 Multiple sclerosis: Secondary | ICD-10-CM | POA: Diagnosis not present

## 2020-11-05 DIAGNOSIS — E039 Hypothyroidism, unspecified: Secondary | ICD-10-CM | POA: Diagnosis not present

## 2020-11-05 DIAGNOSIS — W57XXXA Bitten or stung by nonvenomous insect and other nonvenomous arthropods, initial encounter: Secondary | ICD-10-CM | POA: Diagnosis not present

## 2020-11-05 DIAGNOSIS — Z79899 Other long term (current) drug therapy: Secondary | ICD-10-CM | POA: Diagnosis not present

## 2020-11-05 DIAGNOSIS — Z13220 Encounter for screening for lipoid disorders: Secondary | ICD-10-CM | POA: Diagnosis not present

## 2020-11-05 DIAGNOSIS — R7301 Impaired fasting glucose: Secondary | ICD-10-CM | POA: Diagnosis not present

## 2020-11-05 DIAGNOSIS — G43909 Migraine, unspecified, not intractable, without status migrainosus: Secondary | ICD-10-CM | POA: Diagnosis not present

## 2020-11-05 DIAGNOSIS — Z0000 Encounter for general adult medical examination without abnormal findings: Secondary | ICD-10-CM | POA: Diagnosis not present

## 2020-11-05 DIAGNOSIS — K529 Noninfective gastroenteritis and colitis, unspecified: Secondary | ICD-10-CM | POA: Diagnosis not present

## 2020-11-05 DIAGNOSIS — G4700 Insomnia, unspecified: Secondary | ICD-10-CM | POA: Diagnosis not present

## 2020-11-17 ENCOUNTER — Ambulatory Visit: Payer: Medicare Other | Admitting: Family Medicine

## 2020-11-25 DIAGNOSIS — G35 Multiple sclerosis: Secondary | ICD-10-CM | POA: Diagnosis not present

## 2020-12-14 DIAGNOSIS — R9089 Other abnormal findings on diagnostic imaging of central nervous system: Secondary | ICD-10-CM | POA: Diagnosis not present

## 2020-12-14 DIAGNOSIS — G35 Multiple sclerosis: Secondary | ICD-10-CM | POA: Diagnosis not present

## 2020-12-14 DIAGNOSIS — F32A Depression, unspecified: Secondary | ICD-10-CM | POA: Diagnosis not present

## 2020-12-14 DIAGNOSIS — R519 Headache, unspecified: Secondary | ICD-10-CM | POA: Diagnosis not present

## 2020-12-14 DIAGNOSIS — M792 Neuralgia and neuritis, unspecified: Secondary | ICD-10-CM | POA: Diagnosis not present

## 2020-12-14 DIAGNOSIS — E063 Autoimmune thyroiditis: Secondary | ICD-10-CM | POA: Diagnosis not present

## 2021-01-15 DIAGNOSIS — G35 Multiple sclerosis: Secondary | ICD-10-CM | POA: Diagnosis not present

## 2021-01-15 DIAGNOSIS — D72819 Decreased white blood cell count, unspecified: Secondary | ICD-10-CM | POA: Diagnosis not present

## 2021-02-10 DIAGNOSIS — M792 Neuralgia and neuritis, unspecified: Secondary | ICD-10-CM | POA: Diagnosis not present

## 2021-02-10 DIAGNOSIS — R9089 Other abnormal findings on diagnostic imaging of central nervous system: Secondary | ICD-10-CM | POA: Diagnosis not present

## 2021-02-10 DIAGNOSIS — G35 Multiple sclerosis: Secondary | ICD-10-CM | POA: Diagnosis not present

## 2021-02-10 DIAGNOSIS — R519 Headache, unspecified: Secondary | ICD-10-CM | POA: Diagnosis not present

## 2021-02-10 DIAGNOSIS — G4700 Insomnia, unspecified: Secondary | ICD-10-CM | POA: Diagnosis not present

## 2021-02-10 DIAGNOSIS — F32A Depression, unspecified: Secondary | ICD-10-CM | POA: Diagnosis not present

## 2021-02-17 DIAGNOSIS — R030 Elevated blood-pressure reading, without diagnosis of hypertension: Secondary | ICD-10-CM | POA: Diagnosis not present

## 2021-02-17 DIAGNOSIS — M5412 Radiculopathy, cervical region: Secondary | ICD-10-CM | POA: Diagnosis not present

## 2021-02-17 DIAGNOSIS — S46219A Strain of muscle, fascia and tendon of other parts of biceps, unspecified arm, initial encounter: Secondary | ICD-10-CM | POA: Diagnosis not present

## 2021-02-24 DIAGNOSIS — S46219A Strain of muscle, fascia and tendon of other parts of biceps, unspecified arm, initial encounter: Secondary | ICD-10-CM | POA: Diagnosis not present

## 2021-02-24 DIAGNOSIS — M25511 Pain in right shoulder: Secondary | ICD-10-CM | POA: Diagnosis not present

## 2021-03-11 DIAGNOSIS — S46219A Strain of muscle, fascia and tendon of other parts of biceps, unspecified arm, initial encounter: Secondary | ICD-10-CM | POA: Diagnosis not present

## 2021-03-30 DIAGNOSIS — M25511 Pain in right shoulder: Secondary | ICD-10-CM | POA: Diagnosis not present

## 2021-04-05 DIAGNOSIS — E039 Hypothyroidism, unspecified: Secondary | ICD-10-CM | POA: Diagnosis not present

## 2021-04-21 DIAGNOSIS — M5412 Radiculopathy, cervical region: Secondary | ICD-10-CM | POA: Diagnosis not present

## 2021-05-13 DIAGNOSIS — G35 Multiple sclerosis: Secondary | ICD-10-CM | POA: Diagnosis not present

## 2021-05-14 IMAGING — CT CT ABD-PELV W/ CM
2 of 5 series · 16 of 46 positions shown, 18 images · IV contrast (OMNIPAQUE)
Comparison: None.

CLINICAL DATA: Chronic diarrhea, lower abdominal pain.

EXAM:
CT ABDOMEN AND PELVIS WITH CONTRAST
TECHNIQUE: Multidetector CT imaging of the abdomen and pelvis was performed
using the standard protocol following bolus administration of
intravenous contrast.
CONTRAST:  100mL OMNIPAQUE IOHEXOL 300 MG/ML  SOLN

[Series 2: axial st · axial · 0.68mm/px · z∈[-548,-178]mm · 13 of 86 slices shown, 15 images]
[im 6/86  soft-tissue]
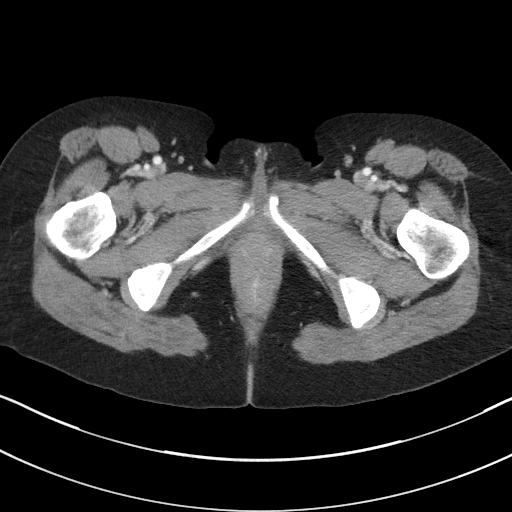
[im 6/86  bone]
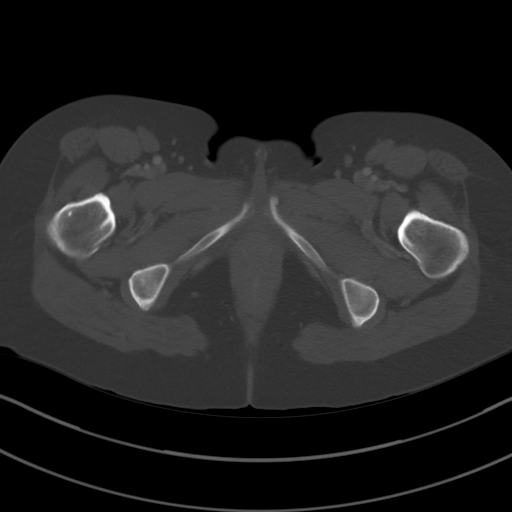
[im 12/86  soft-tissue]
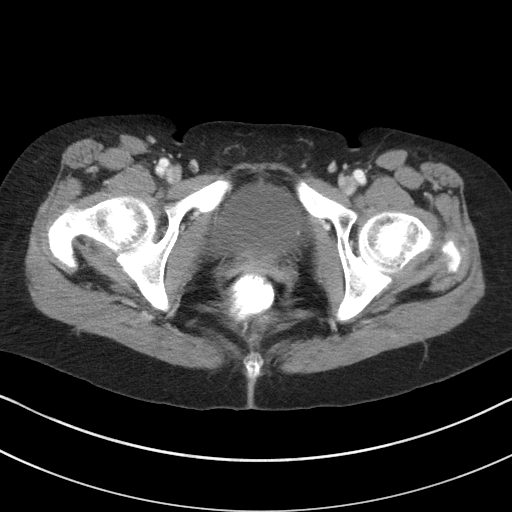
[im 18/86  soft-tissue]
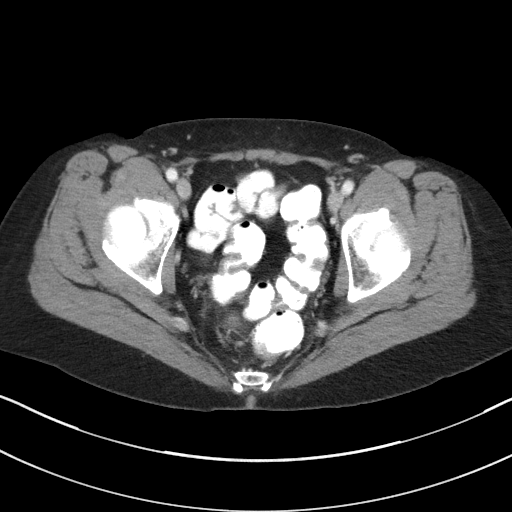
[im 23/86  soft-tissue]
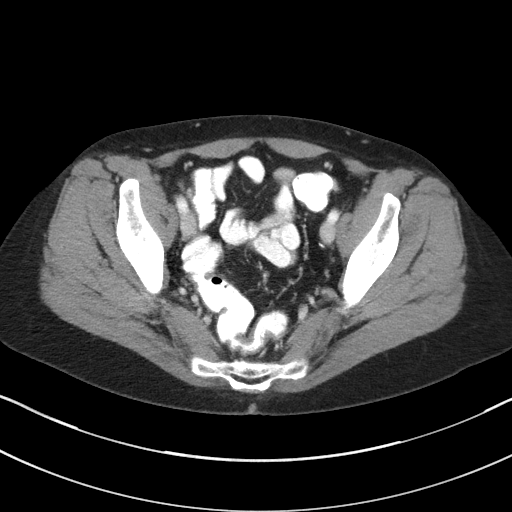
[im 29/86  soft-tissue]
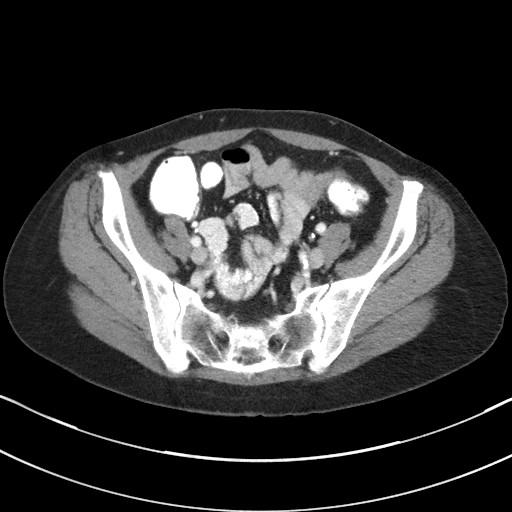
[im 35/86  soft-tissue]
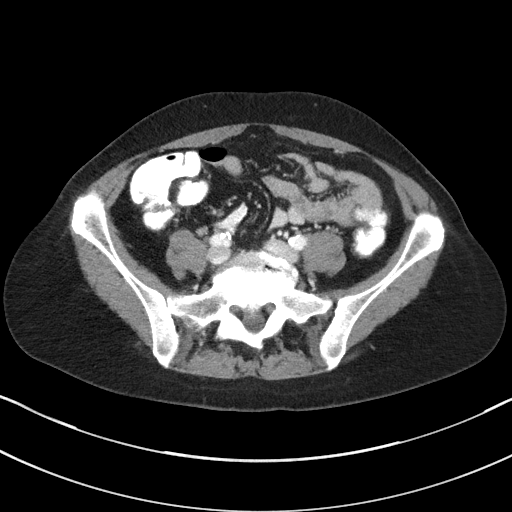
[im 46/86  soft-tissue]
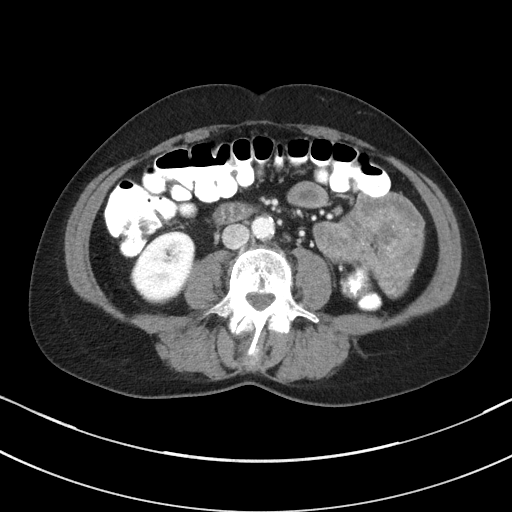
[im 52/86  soft-tissue]
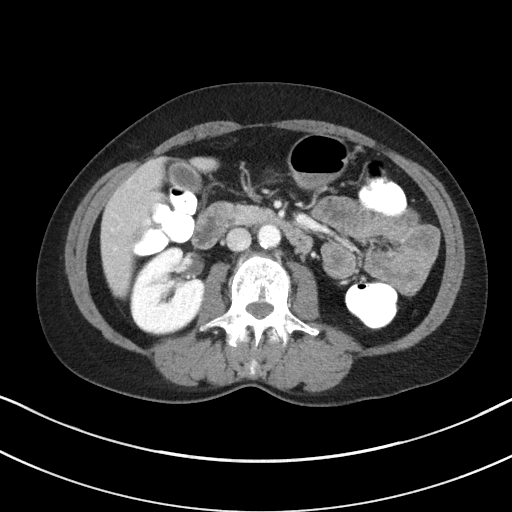
[im 57/86  soft-tissue]
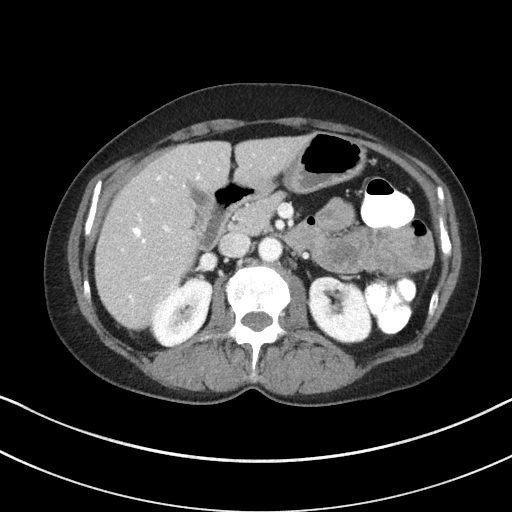
[im 57/86  bone]
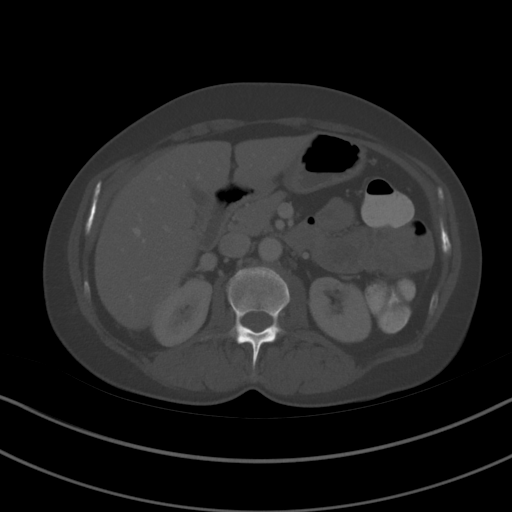
[im 63/86  soft-tissue]
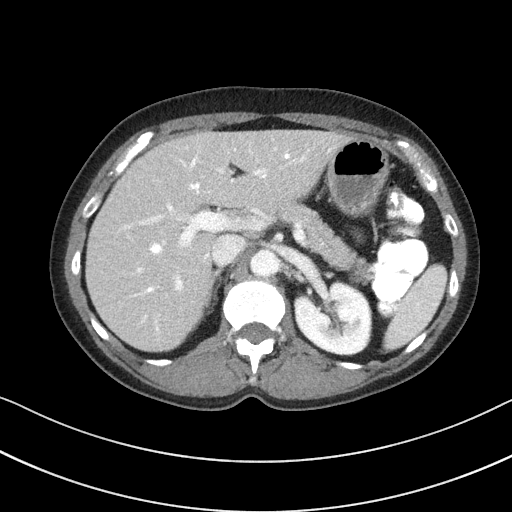
[im 69/86  soft-tissue]
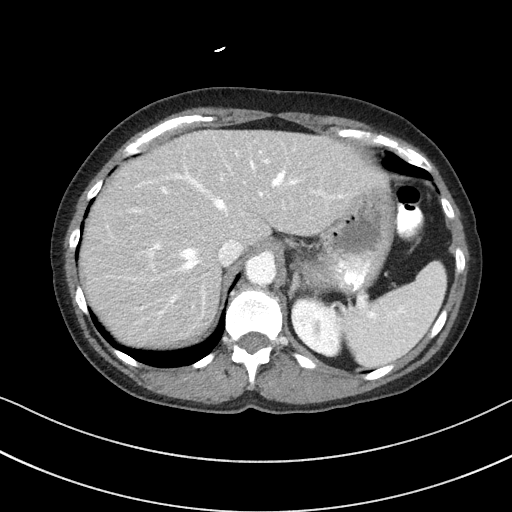
[im 74/86  soft-tissue]
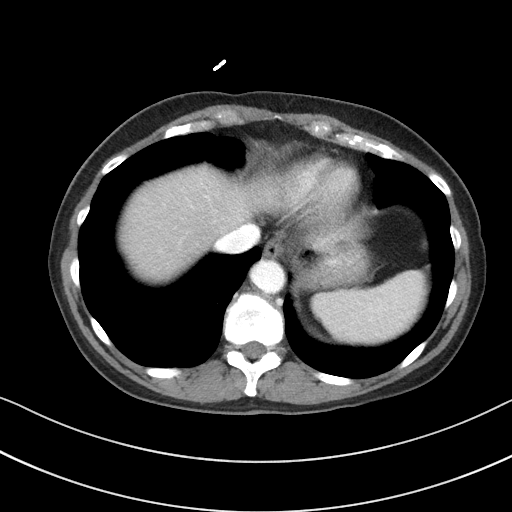
[im 80/86  soft-tissue]
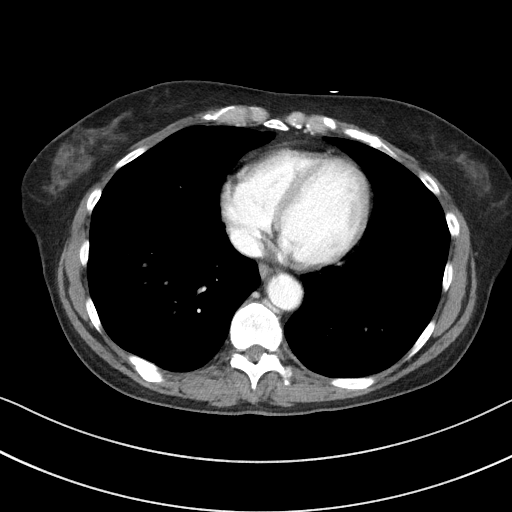

[Series 4: coronal st · coronal · 0.74mm/px · 3 of 76 slices shown]
[im 26/76  soft-tissue]
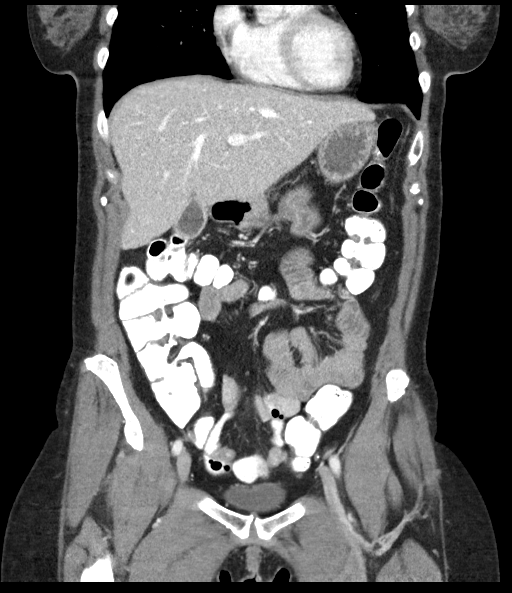
[im 34/76  soft-tissue]
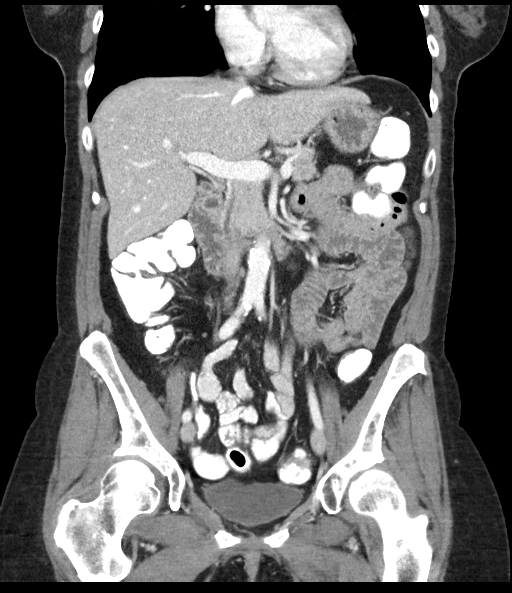
[im 42/76  soft-tissue]
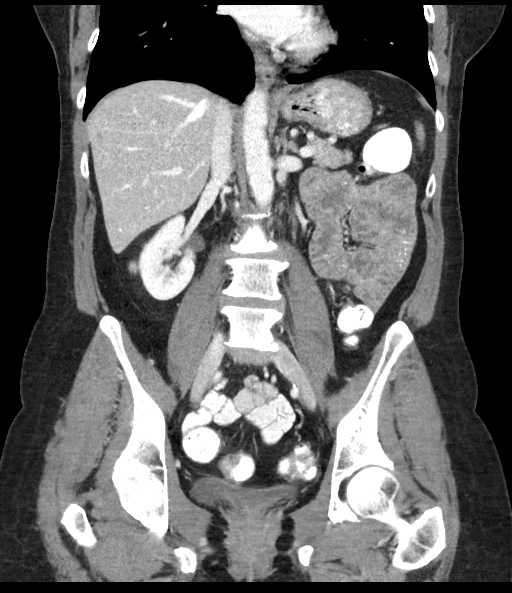

[16 of 46 positions shown; findings below may reference images not displayed]

FINDINGS: Lower chest: No acute abnormality.

Hepatobiliary: There are few scattered small, subcentimeter,
hypodense hepatic lesions which are technically too small to
accurately characterize but favored represent benign hepatic cysts.
Gallbladder is unremarkable. No biliary ductal dilatation.

Pancreas: Unremarkable. No pancreatic ductal dilatation or
surrounding inflammatory changes.

Spleen: Normal in size without focal abnormality.

Adrenals/Urinary Tract: Adrenal glands are unremarkable. Kidneys are
normal, without renal calculi, focal lesion, or hydronephrosis.
Bladder is unremarkable.

Stomach/Bowel: Radiopaque ingested enteric contrast visualized to
the level of the rectum. Stomach is within normal limits. Normal
position of the duodenum and ligament of Treitz. No suspicious small
bowel dilation or wall thickening. Appendix appears normal. No
suspicious colonic wall thickening, distention, or inflammatory
changes.

Vascular/Lymphatic: Aortic atherosclerosis. No enlarged abdominal or
pelvic lymph nodes.

Reproductive: Status post hysterectomy. No adnexal masses.

Other: No abdominopelvic ascites

Musculoskeletal: Multilevel degenerative changes spine. No
suspicious lytic or blastic lesion of bone. No acute osseous
abnormality.
IMPRESSION: 1. No acute abdominopelvic findings.
2. Aortic atherosclerosis.

Aortic Atherosclerosis ([id number].S).

## 2021-05-17 DIAGNOSIS — R9089 Other abnormal findings on diagnostic imaging of central nervous system: Secondary | ICD-10-CM | POA: Diagnosis not present

## 2021-05-17 DIAGNOSIS — D72819 Decreased white blood cell count, unspecified: Secondary | ICD-10-CM | POA: Diagnosis not present

## 2021-05-17 DIAGNOSIS — R200 Anesthesia of skin: Secondary | ICD-10-CM | POA: Diagnosis not present

## 2021-05-17 DIAGNOSIS — M792 Neuralgia and neuritis, unspecified: Secondary | ICD-10-CM | POA: Diagnosis not present

## 2021-05-17 DIAGNOSIS — R519 Headache, unspecified: Secondary | ICD-10-CM | POA: Diagnosis not present

## 2021-05-17 DIAGNOSIS — R202 Paresthesia of skin: Secondary | ICD-10-CM | POA: Diagnosis not present

## 2021-05-17 DIAGNOSIS — R531 Weakness: Secondary | ICD-10-CM | POA: Diagnosis not present

## 2021-05-17 DIAGNOSIS — G4700 Insomnia, unspecified: Secondary | ICD-10-CM | POA: Diagnosis not present

## 2021-05-17 DIAGNOSIS — G35 Multiple sclerosis: Secondary | ICD-10-CM | POA: Diagnosis not present

## 2021-05-17 DIAGNOSIS — F32A Depression, unspecified: Secondary | ICD-10-CM | POA: Diagnosis not present

## 2021-05-18 ENCOUNTER — Other Ambulatory Visit: Payer: Self-pay | Admitting: Psychiatry

## 2021-05-18 DIAGNOSIS — G35 Multiple sclerosis: Secondary | ICD-10-CM | POA: Diagnosis not present

## 2021-06-13 ENCOUNTER — Ambulatory Visit
Admission: RE | Admit: 2021-06-13 | Discharge: 2021-06-13 | Disposition: A | Discharge status: Home / Self Care | Payer: Medicare Other | Source: Ambulatory Visit | Attending: Psychiatry | Admitting: Psychiatry

## 2021-06-13 ENCOUNTER — Other Ambulatory Visit: Payer: Self-pay

## 2021-06-13 DIAGNOSIS — G35 Multiple sclerosis: Secondary | ICD-10-CM

## 2021-06-13 DIAGNOSIS — I619 Nontraumatic intracerebral hemorrhage, unspecified: Secondary | ICD-10-CM | POA: Diagnosis not present

## 2021-06-13 DIAGNOSIS — R569 Unspecified convulsions: Secondary | ICD-10-CM | POA: Diagnosis not present

## 2021-06-13 MED ORDER — GADOBENATE DIMEGLUMINE 529 MG/ML IV SOLN
11.0000 mL | Freq: Once | INTRAVENOUS | Status: AC | PRN
  Administered 2021-06-13: 11 mL via INTRAVENOUS

## 2021-08-24 DIAGNOSIS — G4700 Insomnia, unspecified: Secondary | ICD-10-CM | POA: Diagnosis not present

## 2021-08-24 DIAGNOSIS — R9089 Other abnormal findings on diagnostic imaging of central nervous system: Secondary | ICD-10-CM | POA: Diagnosis not present

## 2021-10-18 ENCOUNTER — Encounter (HOSPITAL_COMMUNITY): Payer: Self-pay

## 2021-10-18 ENCOUNTER — Emergency Department (HOSPITAL_COMMUNITY): Payer: Medicare Other

## 2021-10-18 ENCOUNTER — Emergency Department (HOSPITAL_COMMUNITY)
Admission: EM | Admit: 2021-10-18 | Discharge: 2021-10-19 | Disposition: A | Discharge status: Home / Self Care | Payer: Medicare Other | Attending: Emergency Medicine | Admitting: Emergency Medicine

## 2021-10-18 DIAGNOSIS — M545 Low back pain, unspecified: Secondary | ICD-10-CM | POA: Diagnosis not present

## 2021-10-18 DIAGNOSIS — M549 Dorsalgia, unspecified: Secondary | ICD-10-CM | POA: Diagnosis not present

## 2021-10-18 DIAGNOSIS — W19XXXA Unspecified fall, initial encounter: Secondary | ICD-10-CM | POA: Diagnosis not present

## 2021-10-18 DIAGNOSIS — S39012A Strain of muscle, fascia and tendon of lower back, initial encounter: Secondary | ICD-10-CM | POA: Diagnosis not present

## 2021-10-18 DIAGNOSIS — Y92009 Unspecified place in unspecified non-institutional (private) residence as the place of occurrence of the external cause: Secondary | ICD-10-CM | POA: Insufficient documentation

## 2021-10-18 DIAGNOSIS — S76011A Strain of muscle, fascia and tendon of right hip, initial encounter: Secondary | ICD-10-CM

## 2021-10-18 DIAGNOSIS — M25551 Pain in right hip: Secondary | ICD-10-CM | POA: Diagnosis not present

## 2021-10-18 DIAGNOSIS — S3992XA Unspecified injury of lower back, initial encounter: Secondary | ICD-10-CM | POA: Diagnosis present

## 2021-10-18 DIAGNOSIS — S79911A Unspecified injury of right hip, initial encounter: Secondary | ICD-10-CM | POA: Diagnosis not present

## 2021-10-18 MED ORDER — HYDROMORPHONE HCL 1 MG/ML IJ SOLN
0.5000 mg | Freq: Once | INTRAMUSCULAR | Status: AC
  Administered 2021-10-18: 0.5 mg via INTRAVENOUS
  Filled 2021-10-18: qty 1

## 2021-10-18 MED ORDER — METHOCARBAMOL 500 MG PO TABS: 500.0000 mg | ORAL_TABLET | Freq: Three times a day (TID) | ORAL | 0 refills | Status: DC | PRN

## 2021-10-18 MED ORDER — FENTANYL CITRATE PF 50 MCG/ML IJ SOSY
50.0000 ug | PREFILLED_SYRINGE | Freq: Once | INTRAMUSCULAR | Status: AC
  Administered 2021-10-18: 50 ug via INTRAVENOUS
  Filled 2021-10-18: qty 1

## 2021-10-18 MED ORDER — NAPROXEN 500 MG PO TABS: 500.0000 mg | ORAL_TABLET | Freq: Two times a day (BID) | ORAL | 0 refills | Status: DC | PRN

## 2021-10-18 MED ORDER — METHOCARBAMOL 500 MG PO TABS
500.0000 mg | ORAL_TABLET | Freq: Once | ORAL | Status: AC
Start: 2021-10-18 — End: 2021-10-18
  Administered 2021-10-18: 500 mg via ORAL
  Filled 2021-10-18: qty 1

## 2021-10-18 NOTE — ED Provider Notes (Signed)
?[facility] DEPT ?Provider Note ? ? ?CSN: [id number] ?Arrival date & time: 10/18/21  2044 ? ?  ? ?History ? ?Chief Complaint  ?Patient presents with  ? Fall  ? ? ?Courtney Green is a 56 y.o. female.  Presents to ER with concern for hip pain after fall.  Patient states that she was walking down a hill when she was tripped by a dog, landed on her right hip.  Is having excruciating pain in her right hip as well as her low back.  She denies hitting her head, did not pass out, she denies numbness or tingling in either of her legs or arms.  She received fentanyl prior to arrival which seemed to help some but still having ongoing pain. ? ?Endorses history of multiple sclerosis.  Not on blood thinners. ? ?HPI ? ?  ? ?Home Medications ?Prior to Admission medications   ?Medication Sig Start Date End Date Taking? Authorizing Provider  ?methocarbamol (ROBAXIN) 500 MG tablet Take 1 tablet (500 mg total) by mouth every 8 (eight) hours as needed for muscle spasms. 10/18/21  Yes Lucrezia Starch, MD  ?naproxen (NAPROSYN) 500 MG tablet Take 1 tablet (500 mg total) by mouth 2 (two) times daily as needed. 10/18/21  Yes Lucrezia Starch, MD  ?acetaminophen-codeine (TYLENOL #3) 300-30 MG tablet Take by mouth every 4 (four) hours as needed for moderate pain. ?Patient not taking: Reported on 07/24/2020    [provider]  ?baclofen (LIORESAL) 10 MG tablet Take 10 mg by mouth 3 (three) times daily as needed for muscle spasms.    [provider]  ?clonazePAM (KLONOPIN) 1 MG tablet Take 1 mg by mouth 2 (two) times daily as needed. 04/14/20   [provider]  ?colestipol (COLESTID) 1 g tablet TAKE 1-2 TABLETS (1-2 GM TOTAL) BY MOUTH TWICE DAILY. 10/05/20   Yetta Flock, MD  ?DULoxetine (CYMBALTA) 60 MG capsule TAKE 1 CAPSULE (60 MG TOTAL) BY MOUTH AT BEDTIME. 07/19/19   Isaac Bliss, Rayford Halsted, MD  ?gabapentin (NEURONTIN) 300 MG capsule Take 300 mg by mouth 4 (four)  times daily as needed.    [provider]  ?levothyroxine (SYNTHROID) 112 MCG tablet Take 112 mcg by mouth daily before breakfast.    [provider]  ?loperamide (IMODIUM) 2 MG capsule Take by mouth as needed for diarrhea or loose stools.    [provider]  ?montelukast (SINGULAIR) 10 MG tablet Take 10 mg by mouth daily as needed.    [provider]  ?ocrelizumab 600 mg in sodium chloride 0.9 % 500 mL Inject 600 mg into the vein. Twice a year ?Patient not taking: No sig reported    [provider]  ?Peppermint Oil (IBGARD) 90 MG CPCR Take as directed 06/09/20   Armbruster, Carlota Raspberry, MD  ?QUEtiapine (SEROQUEL) 100 MG tablet Take 100 mg by mouth at bedtime.    [provider]  ?Vilazodone HCl (VIIBRYD) 40 MG TABS 40 mg at bedtime.     [provider]  ?   ? ?Allergies    ?Butalbital-apap-caff-cod, Morphine and related, Pregabalin, Carbamazepine, Amitriptyline, Bentyl [dicyclomine], Topiramate, Aspirin, Divalproex sodium, and Doxycycline   ? ?Review of Systems   ?Review of Systems  ?Musculoskeletal:  Positive for arthralgias.  ?All other systems reviewed and are negative. ? ?Physical Exam ?Updated Vital Signs ?BP 138/85   Pulse 63   Temp 98.5 ?F (36.9 ?C) (Oral)   Resp 18   Ht '5\' 4"'$  (1.626  m)   Wt 57.2 kg   SpO2 96%   BMI 21.63 kg/m?  ?Physical Exam ?Vitals and nursing note reviewed.  ?Constitutional:   ?   General: She is not in acute distress. ?   Appearance: She is well-developed.  ?HENT:  ?   Head: Normocephalic and atraumatic.  ?Eyes:  ?   Conjunctiva/sclera: Conjunctivae normal.  ?Cardiovascular:  ?   Rate and Rhythm: Normal rate and regular rhythm.  ?   Heart sounds: No murmur heard. ?Pulmonary:  ?   Effort: Pulmonary effort is normal. No respiratory distress.  ?   Breath sounds: Normal breath sounds.  ?Abdominal:  ?   Palpations: Abdomen is soft.  ?   Tenderness: There is no abdominal tenderness.  ?Musculoskeletal:     ?   General: No  swelling.  ?   Cervical back: Neck supple.  ?   Comments: Back: Some tenderness to the lumbar spine, there is no tenderness to the T-spine, no step-off or deformity RUE: no TTP throughout, no deformity, normal joint ROM, radial pulse intact, distal sensation and motor intact ?LUE: no TTP throughout, no deformity, normal joint ROM, radial pulse intact, distal sensation and motor intact ?RLE: Tenderness present to the right hip, distal pulse, sensation and motor intact ?LLE: no TTP throughout, no deformity, normal joint ROM, distal pulse, sensation and motor intact  ?Skin: ?   General: Skin is warm and dry.  ?   Capillary Refill: Capillary refill takes less than 2 seconds.  ?Neurological:  ?   Mental Status: She is alert.  ?   Comments: Normal strength/sensation in b/l LE  ?Psychiatric:     ?   Mood and Affect: Mood normal.  ? ? ?ED Results / Procedures / Treatments   ?Labs ?(all labs ordered are listed, but only abnormal results are displayed) ?Labs Reviewed - No data to display ? ?EKG ?None ? ?Radiology ?CT Lumbar Spine Wo Contrast ? ?Result Date: 10/18/2021 ?CLINICAL DATA:  Low back pain after fall EXAM: CT LUMBAR SPINE WITHOUT CONTRAST TECHNIQUE: Multidetector CT imaging of the lumbar spine was performed without intravenous contrast administration. Multiplanar CT image reconstructions were also generated. RADIATION DOSE REDUCTION: This exam was performed according to the departmental dose-optimization program which includes automated exposure control, adjustment of the mA and/or kV according to patient size and/or use of iterative reconstruction technique. COMPARISON:  CT 08/04/2020 FINDINGS: Segmentation: 5 lumbar type vertebrae. Alignment: Normal. Vertebrae: No acute fracture or focal pathologic process. Paraspinal and other soft tissues: Negative. Disc levels: At T12-L1, maintained disc space. No canal stenosis. The foramen are patent bilaterally. At L1-L2, maintained disc space. No canal stenosis. The foramen  are patent bilaterally. At L2-L3, maintained disc space. No canal stenosis. The foramen are patent bilaterally. At L3-L4, moderate disc space narrowing. Diffuse disc bulge without high-grade canal stenosis. Facet degenerative changes bilaterally. Mild to moderate right and mild left foraminal narrowing. At L4-L5, mild diffuse disc bulge. No high-grade canal stenosis. Facet degenerative changes bilaterally. Mild bilateral foraminal narrowing. At L5-S1, moderate disc space narrowing with vacuum disc. No high-grade canal stenosis. Moderate facet degenerative changes. Moderate left greater than right foraminal narrowing. IMPRESSION: 1. No acute osseous abnormality. 2. Multilevel degenerative changes without high-grade canal stenosis. Multilevel foraminal narrowing, worst at L5-S1. Electronically Signed   By: Donavan Foil M.D.   On: 10/18/2021 22:36  ? ?CT Hip Right Wo Contrast ? ?Result Date: 10/18/2021 ?CLINICAL DATA:  Hip trauma, fracture suspected. EXAM: CT OF THE RIGHT HIP  WITHOUT CONTRAST TECHNIQUE: Multidetector CT imaging of the right hip was performed according to the standard protocol. Multiplanar CT image reconstructions were also generated. RADIATION DOSE REDUCTION: This exam was performed according to the departmental dose-optimization program which includes automated exposure control, adjustment of the mA and/or kV according to patient size and/or use of iterative reconstruction technique. COMPARISON:  Hip radiograph performed earlier on the same date. FINDINGS: Bones/Joint/Cartilage No fracture or dislocation. Normal alignment. No joint effusion. Pubic symphysis and sacroiliac joint is intact. Ligaments Ligaments are suboptimally evaluated by CT. Muscles and Tendons Muscles are normal in bulk. No evidence of fatty infiltration. No intramuscular hematoma. Tendons are intact. Soft tissue No fluid collection or hematoma.  No soft tissue mass. IMPRESSION: 1.  No evidence of fracture or dislocation. 2.  No  evidence of significant soft tissue injury. Electronically Signed   By: Keane Police D.O.   On: 10/18/2021 22:35  ? ?DG Hip Unilat W or Wo Pelvis 2-3 Views Right ? ?Result Date: 10/18/2021 ?CLINICAL DATA:  Fall walking dog

## 2021-10-18 NOTE — Discharge Instructions (Addendum)
Recommend following up with either your primary doctor or an orthopedic specialist.  Come back to ER if you develop uncontrolled pain, inability to walk, or other new concerning symptom. ? ?Recommend taking the prescribed muscle relaxer and anti-inflammatory pain medicine as needed for your symptoms. ?

## 2021-10-18 NOTE — ED Triage Notes (Signed)
Pt comes via [facility] from home, was walking down a hill and was tripped by her dog, landed on R hip pain and lower back pain. PTA received 100 mcg of fentanyl. Did not hit head, no LOC  ?

## 2021-11-01 ENCOUNTER — Other Ambulatory Visit: Payer: Self-pay | Admitting: Gastroenterology

## 2021-11-04 ENCOUNTER — Encounter: Payer: Self-pay | Admitting: Neurology

## 2021-11-04 ENCOUNTER — Ambulatory Visit: Payer: Medicare Other | Admitting: Neurology

## 2021-11-04 VITALS — BP 134/83 | HR 66 | Ht 64.0 in | Wt 124.5 lb

## 2021-11-04 DIAGNOSIS — G40909 Epilepsy, unspecified, not intractable, without status epilepticus: Secondary | ICD-10-CM

## 2021-11-04 DIAGNOSIS — R252 Cramp and spasm: Secondary | ICD-10-CM | POA: Diagnosis not present

## 2021-11-04 DIAGNOSIS — R202 Paresthesia of skin: Secondary | ICD-10-CM

## 2021-11-04 DIAGNOSIS — R5383 Other fatigue: Secondary | ICD-10-CM

## 2021-11-04 DIAGNOSIS — R9082 White matter disease, unspecified: Secondary | ICD-10-CM

## 2021-11-04 NOTE — Progress Notes (Addendum)
? ?[facility] ? ?PATIENT: Courtney Green ?DOB: January 14, 1966 ? ?REFERRING DOCTOR OR PCP: Janie Morning, DO (PCP); [facility] neurology ?SOURCE: Patient, notes from Drs.  Enis Gash, and Dellis Filbert, imaging and laboratory reports, MRI images personally reviewed. ? ?_________________________________ ? ? ?HISTORICAL ? ?CHIEF COMPLAINT:  ?Chief Complaint  ?Patient presents with  ? New Patient (Initial Visit)  ?  Rm 1, alone. Pt referred by Dr. Dellis Filbert for MS TOC. On Ocrevus and tolerating well. Last infusion:06/12/21  and next infusion: 11/16/2021. Having more cramping in R calf. Pins/needles in feet, calf, hands.   ? ? ?HISTORY OF PRESENT ILLNESS:  ? ?I had the pleasure of seeing your patient, Courtney Green, at the [place] [facility] for neurologic consultation regarding her MS. ? ?She is a 56 yo woman who was diagnosed with MS in 2020 but reports having an episode of work in 2017 with weakness.    That day she had to do a lt of walking and was feeling weaker when she got to er desk.  She then had a beesting sensation in her legs that went up to her arms as the day continued.  Then, she felt very weak and slumped over her desk.   Over the next couple weeks, symptoms continued.    She had an MRI that showed some white matter foci.   She had no foci in her spinal cord.  She first saw Dr. Sabra Heck who felt that she likely did have MS and then had a telemedicine visit with Dr. Evalee Mutton in [place] who felt that she did not have MS.  Later, she was referred to Dr. Jacqulynn Cadet who diagnosed MS.   She did not have a lumbar puncture to confirm the diagnosis.   She reports having several flare-ups with right arm symptoms and took high dose oral steroids once.   ? ?Currently, she notes mild difficulties with gait and balance at times.  She reports the left leg giving out.  She has muscle cramps in the legs, left greater than right.  Baclofen has helped.  She experiences  dysesthesias in the limbs and pain in the right arm at times bladder function is fine.  Vision is usually fine. ? ?She has fatigue.   She has a h/o of Hashimoto's.   Two relatives have had thyroid cancer.    She is on levothyroxine.   She is also on Adderall for fatigue.     She has depression and is on Viibryd and duloxetine.    ? ?She has had seizures since May 2012.   It was a grand mal seziure and she fell to the floor hitting her head.  She had several other milder seizures after that.   She gets an aura x 3-4 seconds of a sensation coming over her.   If tries to get to a chair if that happens.    She loses consciousness.  He worse one was the first one where she had LOC x 5 minutes and bit her tongue.   She had HA's following that seizure.      She is on carbamazepine 200 mg po bid.     ? ?She reports a lot of muscle cramps in her calves, initially on her left, and now bilaterally.    She notes heat intolerance.   ?She had a couple head injuries including a fall off a horse many years ago with a mild concussion.  She had a fall in 2012 with her  first GTC seizure.    She has some pain in the right arm.  She had cervical ESI without a benefit.   She is on baclofen 10 mg po bid.    She takes gabapentin ? ?Vascular risks: ?She does not have hypertension or diabetes.  She has been a smoker. ? ?She had several episodes of head trauma with probable postconcussive symptoms many years ago when she fell off a horse and again in 2012 when she fell after her first seizure. ? ?Imaging: ?MRI of the brain 06/15/2021 shows some scattered T2/FLAIR hyperintense foci, mostly in the subcortical white matter.  These are nonspecific. ? ?MRI of the cervical spine 01/22/2020 showed a normal spinal cord.  There is mild spinal stenosis at C4-C5, C5-C6 and C6-C7.  At C6-C7, there is moderately severe right foraminal narrowing that could affect the right C7 nerve root.  Some foraminal narrowing but no definite nerve root  compression ? ?REVIEW OF SYSTEMS: ?Constitutional: No fevers, chills, sweats, or change in appetite.  Fatigue ?Eyes: No visual changes, double vision, eye pain ?Ear, nose and throat: No hearing loss, ear pain, nasal congestion, sore throat ?Cardiovascular: No chest pain, palpitations ?Respiratory:  No shortness of breath at rest or with exertion.   No wheezes ?GastrointestinaI: No nausea, vomiting, diarrhea, abdominal pain, fecal incontinence ?Genitourinary:  No dysuria, urinary retention or frequency.  No nocturia. ?Musculoskeletal: Neck pain.  Muscle cramps, pain in the legs left greater than right ?Integumentary: No rash, pruritus, skin lesions ?Neurological: as above ?Psychiatric: No depression at this time.  No anxiety ?Endocrine: She has a history of Hashimoto's thyroiditis  ?hematologic/Lymphatic:  No anemia, purpura, petechiae. ?Allergic/Immunologic: No itchy/runny eyes, nasal congestion, recent allergic reactions, rashes ? ?ALLERGIES: ?Allergies  ?Allergen Reactions  ? Butalbital-Apap-Caff-Cod Itching  ? Morphine And Related Itching  ? Pregabalin Itching  ? Carbamazepine Rash  ? Amitriptyline Other (See Comments)  ? Bentyl [Dicyclomine]   ?  itching  ? Methocarbamol Nausea And Vomiting  ? Topiramate Diarrhea  ? Aspirin Nausea Only  ? Divalproex Sodium Rash  ?  Hair loss  ? Doxycycline Itching and Rash  ? ? ?HOME MEDICATIONS: ? ?Current Outpatient Medications:  ?  acetaminophen-codeine (TYLENOL #3) 300-30 MG tablet, Take by mouth every 4 (four) hours as needed for moderate pain., Disp: , Rfl:  ?  baclofen (LIORESAL) 10 MG tablet, Take 10 mg by mouth 3 (three) times daily as needed for muscle spasms., Disp: , Rfl:  ?  carbamazepine (TEGRETOL) 200 MG tablet, Take 200 mg by mouth 2 (two) times daily., Disp: , Rfl:  ?  clonazePAM (KLONOPIN) 1 MG tablet, Take 1 mg by mouth 2 (two) times daily as needed., Disp: , Rfl:  ?  colestipol (COLESTID) 1 g tablet, TAKE 1-2 TABLETS (1-2 GM TOTAL) BY MOUTH TWICE DAILY.,  Disp: 60 tablet, Rfl: 1 ?  DULoxetine (CYMBALTA) 60 MG capsule, TAKE 1 CAPSULE (60 MG TOTAL) BY MOUTH AT BEDTIME., Disp: 90 capsule, Rfl: 0 ?  gabapentin (NEURONTIN) 300 MG capsule, Take 300 mg by mouth 4 (four) times daily as needed., Disp: , Rfl:  ?  levothyroxine (SYNTHROID) 112 MCG tablet, Take 112 mcg by mouth daily before breakfast., Disp: , Rfl:  ?  loperamide (IMODIUM) 2 MG capsule, Take by mouth as needed for diarrhea or loose stools., Disp: , Rfl:  ?  montelukast (SINGULAIR) 10 MG tablet, Take 10 mg by mouth daily as needed., Disp: , Rfl:  ?  naproxen (NAPROSYN) 500 MG tablet, Take 1  tablet (500 mg total) by mouth 2 (two) times daily as needed., Disp: 30 tablet, Rfl: 0 ?  ocrelizumab 600 mg in sodium chloride 0.9 % 500 mL, Inject 600 mg into the vein. Twice a year, Disp: , Rfl:  ?  QUEtiapine (SEROQUEL) 100 MG tablet, Take 100 mg by mouth at bedtime., Disp: , Rfl:  ?  SYNTHROID 100 MCG tablet, Take 100 mcg by mouth every morning., Disp: , Rfl:  ?  Vilazodone HCl (VIIBRYD) 40 MG TABS, 40 mg at bedtime. , Disp: , Rfl:  ? ?PAST MEDICAL HISTORY: ?Past Medical History:  ?Diagnosis Date  ? Allergy   ? Anemia   ? Anxiety   ? Depression   ? Fibromyalgia   ? Headache(784.0)   ? History of colon polyps   ? Insomnia   ? Multiple sclerosis, relapsing-remitting ([place]) 2016  ? Dr. Dellis Filbert - [facility] Aus  ? Seizures ([place])   ? Thyroid disease   ? Hashimoto's thyroiditis 10-2018  ? ? ?PAST SURGICAL HISTORY: ?Past Surgical History:  ?Procedure Laterality Date  ? ABDOMINAL HYSTERECTOMY    ? COLONOSCOPY  2014  ? 5 polyps 2 was precanerous. Dr Collene Mares  ? KNEE ARTHROSCOPY Left   ? ? ?FAMILY HISTORY: ?Family History  ?Problem Relation Age of Onset  ? Diabetes Mother   ? Stomach cancer Maternal Grandmother   ? Colon cancer Maternal Grandmother   ? Birth defects Maternal Uncle   ?     thyroid   ? ? ?SOCIAL HISTORY: ? ?Social History  ? ?Socioeconomic History  ? Marital status: Divorced  ?  Spouse name: Not on file  ? Number of children:  2  ? Years of education: Not on file  ? Highest education level: Associate degree: academic program  ?Occupational History  ? Not on file  ?Tobacco Use  ? Smoking status: Former  ?  Packs/day: 0.20  ?  Types: Cigarettes  ?  Quit Training and development officer

## 2021-11-11 DIAGNOSIS — Z13220 Encounter for screening for lipoid disorders: Secondary | ICD-10-CM | POA: Diagnosis not present

## 2021-11-11 DIAGNOSIS — E063 Autoimmune thyroiditis: Secondary | ICD-10-CM | POA: Diagnosis not present

## 2021-11-11 DIAGNOSIS — Z79899 Other long term (current) drug therapy: Secondary | ICD-10-CM | POA: Diagnosis not present

## 2021-11-11 DIAGNOSIS — E039 Hypothyroidism, unspecified: Secondary | ICD-10-CM | POA: Diagnosis not present

## 2021-11-11 DIAGNOSIS — R7301 Impaired fasting glucose: Secondary | ICD-10-CM | POA: Diagnosis not present

## 2021-11-15 ENCOUNTER — Ambulatory Visit
Admission: RE | Admit: 2021-11-15 | Discharge: 2021-11-15 | Disposition: A | Discharge status: Home / Self Care | Payer: Medicare Other | Source: Ambulatory Visit | Attending: Neurology | Admitting: Neurology

## 2021-11-15 ENCOUNTER — Other Ambulatory Visit: Payer: Self-pay | Admitting: Gastroenterology

## 2021-11-15 VITALS — BP 140/87 | HR 59

## 2021-11-15 DIAGNOSIS — R569 Unspecified convulsions: Secondary | ICD-10-CM

## 2021-11-15 DIAGNOSIS — R202 Paresthesia of skin: Secondary | ICD-10-CM

## 2021-11-15 DIAGNOSIS — R9082 White matter disease, unspecified: Secondary | ICD-10-CM | POA: Diagnosis not present

## 2021-11-15 DIAGNOSIS — Z0389 Encounter for observation for other suspected diseases and conditions ruled out: Secondary | ICD-10-CM | POA: Diagnosis not present

## 2021-11-15 NOTE — Progress Notes (Signed)
1 vial of blood drawn from pts RAC to be sent off with LP lab work. 1 successful attempt. Gauze and tape applied after.  ?

## 2021-11-15 NOTE — Discharge Instructions (Signed)

## 2021-11-15 NOTE — Progress Notes (Signed)
Blood cell counts and glucose are normal. Protein is elevated which is nonspecific. Oligoclonal bands, IgG, and VDRL are still pending.

## 2021-11-17 ENCOUNTER — Telehealth: Payer: Self-pay | Admitting: *Deleted

## 2021-11-17 NOTE — Telephone Encounter (Signed)
-----   Message from Genia Harold, MD sent at 11/15/2021  4:25 PM EDT ----- ?Blood cell counts and glucose are normal. Protein is elevated which is nonspecific. Oligoclonal bands, IgG, and VDRL are still pending.  ?

## 2021-11-17 NOTE — Telephone Encounter (Signed)
Called and spoke with pt about results per Dr. Billey Gosling. Aware we will reach back out once pending labs come back to go over those. She verbalized understanding.  ?

## 2021-11-23 NOTE — Telephone Encounter (Addendum)
Called [facility] at [phone] and spoke with Ebony Hail. Turn around time for oligoclonal bands is 10 days. Received around 11/18/21. Should have result today or tomorrow.  ?

## 2021-11-24 LAB — CNS IGG SYNTHESIS RATE, CSF+BLOOD
Albumin Serum: 5.3 g/dL — ABNORMAL HIGH (ref 3.6–5.1)
Albumin, CSF: 38.3 mg/dL (ref 8.0–42.0)
CNS-IgG Synthesis Rate: 4.1 mg/24 h — ABNORMAL HIGH (ref <=3.3)
IgG (Immunoglobin G), Serum: 652 mg/dL (ref 600–1640)
IgG Total CSF: 3.4 mg/dL (ref 0.8–7.7)
IgG-Index: 0.72 — ABNORMAL HIGH (ref <0.70)

## 2021-11-24 LAB — CSF CELL COUNT WITH DIFFERENTIAL
RBC Count, CSF: 2 cells/uL — ABNORMAL HIGH
TOTAL NUCLEATED CELL: 2 cells/uL (ref 0–5)

## 2021-11-24 LAB — GLUCOSE, CSF: Glucose, CSF: 54 mg/dL (ref 40–80)

## 2021-11-24 LAB — VDRL, CSF: VDRL Quant, CSF: NONREACTIVE

## 2021-11-24 LAB — OLIGOCLONAL BANDS, CSF + SERM: Oligo Bands: ABSENT

## 2021-11-24 LAB — PROTEIN, CSF: Total Protein, CSF: 69 mg/dL — ABNORMAL HIGH (ref 15–45)

## 2021-11-25 ENCOUNTER — Encounter: Payer: Self-pay | Admitting: Neurology

## 2021-11-29 DIAGNOSIS — G4700 Insomnia, unspecified: Secondary | ICD-10-CM | POA: Diagnosis not present

## 2021-11-29 DIAGNOSIS — G35 Multiple sclerosis: Secondary | ICD-10-CM | POA: Diagnosis not present

## 2021-11-29 DIAGNOSIS — Z0000 Encounter for general adult medical examination without abnormal findings: Secondary | ICD-10-CM | POA: Diagnosis not present

## 2021-11-29 DIAGNOSIS — E039 Hypothyroidism, unspecified: Secondary | ICD-10-CM | POA: Diagnosis not present

## 2021-11-29 DIAGNOSIS — K529 Noninfective gastroenteritis and colitis, unspecified: Secondary | ICD-10-CM | POA: Diagnosis not present

## 2021-11-29 DIAGNOSIS — G43909 Migraine, unspecified, not intractable, without status migrainosus: Secondary | ICD-10-CM | POA: Diagnosis not present

## 2021-11-30 NOTE — Telephone Encounter (Signed)
Dr. Felecia Shelling- Pt sent appt request wanting sooner appt to discuss next steps. Is there an update you want to provide the pt? I see the oligoclonal bands are back.

## 2021-12-13 DIAGNOSIS — M7652 Patellar tendinitis, left knee: Secondary | ICD-10-CM | POA: Diagnosis not present

## 2021-12-13 DIAGNOSIS — M25562 Pain in left knee: Secondary | ICD-10-CM | POA: Diagnosis not present

## 2021-12-13 DIAGNOSIS — M76892 Other specified enthesopathies of left lower limb, excluding foot: Secondary | ICD-10-CM | POA: Diagnosis not present

## 2021-12-13 DIAGNOSIS — M2242 Chondromalacia patellae, left knee: Secondary | ICD-10-CM | POA: Diagnosis not present

## 2021-12-14 ENCOUNTER — Ambulatory Visit: Payer: Medicare Other | Admitting: Neurology

## 2021-12-14 ENCOUNTER — Encounter: Payer: Self-pay | Admitting: Neurology

## 2021-12-14 VITALS — BP 133/87 | HR 62 | Ht 64.0 in | Wt 123.5 lb

## 2021-12-14 DIAGNOSIS — R202 Paresthesia of skin: Secondary | ICD-10-CM

## 2021-12-14 DIAGNOSIS — R252 Cramp and spasm: Secondary | ICD-10-CM

## 2021-12-14 DIAGNOSIS — R5383 Other fatigue: Secondary | ICD-10-CM | POA: Diagnosis not present

## 2021-12-14 DIAGNOSIS — M542 Cervicalgia: Secondary | ICD-10-CM

## 2021-12-14 DIAGNOSIS — G40909 Epilepsy, unspecified, not intractable, without status epilepticus: Secondary | ICD-10-CM | POA: Diagnosis not present

## 2021-12-14 DIAGNOSIS — R9082 White matter disease, unspecified: Secondary | ICD-10-CM | POA: Diagnosis not present

## 2021-12-14 NOTE — Progress Notes (Signed)
[facility]  PATIENT: Courtney Green DOB: [birth year]  REFERRING DOCTOR OR PCP: Janie Morning, DO (PCP); [facility] neurology SOURCE: Patient, notes from Drs.  Enis Gash, and Dellis Filbert, imaging and laboratory reports, MRI images personally reviewed.  _________________________________   HISTORICAL  CHIEF COMPLAINT:  Chief Complaint  Patient presents with   Follow-up    Rm 2, alone. Here to discuss results and treatment plan. Pt currently feeling dizzy.     HISTORY OF PRESENT ILLNESS:   [name] is a 56 y.o. woman with a diagnosis of MS.  Update 12/14/2021: At the last visit, I reviewed her imaging studies and history and question the diagnosis of multiple sclerosis.  The changes in the brain are nonspecific and mild, actually fairly normal for age.  There were no lesions in the infratentorial brain or spinal cord.  Therefore, I wanted additional information.  Since the last visit, she had a lumbar puncture.   CSF showed elevated IgG index.   She had paired bands in the CSF and serum (4) but no bands just in the CSF (OCB seen in MS).   She comes back today to further discuss.  She has a long history of bee stinging dysesthesias preceding her diagnosis but more than 2 decades.    Currently, she notes mild difficulties with gait and balance at times.  She reports the left leg giving out.  She has muscle cramps in the legs, left greater than right.  Baclofen has helped.  She experiences dysesthesias in the limbs and pain in the right arm at times bladder function is fine.  Vision is usually fine.  She has fatigue.   She has a h/o of Hashimoto's.   Two relatives have had thyroid cancer.    She is on levothyroxine.   She is also on Adderall for fatigue.     She has depression and is on Viibryd and duloxetine.     She has had seizures since May 2012.   It was a grand mal seziure and she fell to the floor hitting her head.  She had several other milder  seizures after that.   She gets an aura x 3-4 seconds of a sensation coming over her.   If tries to get to a chair if that happens.    She loses consciousness.  He worse one was the first one where she had LOC x 5 minutes and bit her tongue.   She had HA's following that seizure.      She is on carbamazepine 200 mg po bid.      She continues to experience muscle cramps in her calves.    She notes heat intolerance.    She was having a lot of headaches.   She feels the headaches were better after she started Ocrevus.    Pain is in the occiput and not associated with migrainous features.    She currently does not have a headahce and has only had a few since the Ocrevus started.   Prior to [place] she would have HA many days a month.    She was tearful a couple times during the visit today.  She has a positive who had primary progressive MS.  History of MS diagnosis: She was diagnosed with MS in 2020 but reports having an episode of work in 2017 with weakness.    That day she had to do a lt of walking and was feeling weaker when she got to er desk.  She then had a  bee-sting sensation in her legs that went up to her arms as the day continued.  Then, she felt very weak and slumped over her desk.   Over the next couple weeks, symptoms continued.    She had an MRI that showed some white matter foci.   She had no foci in her spinal cord.  She first saw Dr. Sabra Heck who felt that she likely did have MS and then had a telemedicine visit with Dr. Evalee Mutton in [place] who felt that she did not have MS.  Later, she was referred to Dr. Jacqulynn Cadet who diagnosed MS.  In the past, since the 1990's, she has had some of the sensory bee-sting dysesthesia sensations.  History of head trauma/seizure: She had a couple head injuries including a fall off a horse many years ago with a mild concussion.  She had a fall in 2012 with her first GTC seizure.  There was loss of consciousness for about 7-10.    Vascular risks: She does  not have hypertension or diabetes.  She has been a smoker.  Other risks for MRI changes: She had several episodes of head trauma with probable postconcussive symptoms many years ago when she fell off a horse and again in 2012 when she fell after her first seizure.  Imaging: MRI of the brain 06/15/2021 shows some scattered T2/FLAIR hyperintense foci, mostly in the subcortical white matter.  These are nonspecific.  MRI of the cervical spine 01/22/2020 showed a normal spinal cord.  There is mild spinal stenosis at C4-C5, C5-C6 and C6-C7.  At C6-C7, there is moderately severe right foraminal narrowing that could affect the right C7 nerve root.  Some foraminal narrowing but no definite nerve root compression  REVIEW OF SYSTEMS: Constitutional: No fevers, chills, sweats, or change in appetite.  Fatigue Eyes: No visual changes, double vision, eye pain Ear, nose and throat: No hearing loss, ear pain, nasal congestion, sore throat Cardiovascular: No chest pain, palpitations Respiratory:  No shortness of breath at rest or with exertion.   No wheezes GastrointestinaI: No nausea, vomiting, diarrhea, abdominal pain, fecal incontinence Genitourinary:  No dysuria, urinary retention or frequency.  No nocturia. Musculoskeletal: Neck pain.  Muscle cramps, pain in the legs left greater than right Integumentary: No rash, pruritus, skin lesions Neurological: as above Psychiatric: No depression at this time.  No anxiety Endocrine: She has a history of Hashimoto's thyroiditis  hematologic/Lymphatic:  No anemia, purpura, petechiae. Allergic/Immunologic: No itchy/runny eyes, nasal congestion, recent allergic reactions, rashes  ALLERGIES: Allergies  Allergen Reactions   Butalbital-Apap-Caff-Cod Itching   Morphine And Related Itching   Pregabalin Itching   Carbamazepine Rash   Amitriptyline Other (See Comments)   Bentyl [Dicyclomine]     itching   Methocarbamol Nausea And Vomiting   Topiramate Diarrhea    Aspirin Nausea Only   Divalproex Sodium Rash    Hair loss   Doxycycline Itching and Rash    HOME MEDICATIONS:  Current Outpatient Medications:    acetaminophen-codeine (TYLENOL #3) 300-30 MG tablet, Take by mouth every 4 (four) hours as needed for moderate pain., Disp: , Rfl:    baclofen (LIORESAL) 10 MG tablet, Take 10 mg by mouth 3 (three) times daily as needed for muscle spasms., Disp: , Rfl:    carbamazepine (TEGRETOL) 200 MG tablet, Take 200 mg by mouth 2 (two) times daily., Disp: , Rfl:    clonazePAM (KLONOPIN) 1 MG tablet, Take 1 mg by mouth 2 (two) times daily as needed.,  Disp: , Rfl:    cloNIDine (CATAPRES) 0.1 MG tablet, Take 0.1-0.2 mg by mouth at bedtime., Disp: , Rfl:    colestipol (COLESTID) 1 g tablet, TAKE 1-2 TABLETS (1-2 GM TOTAL) BY MOUTH TWICE DAILY., Disp: 60 tablet, Rfl: 1   DULoxetine (CYMBALTA) 60 MG capsule, TAKE 1 CAPSULE (60 MG TOTAL) BY MOUTH AT BEDTIME., Disp: 90 capsule, Rfl: 0   gabapentin (NEURONTIN) 300 MG capsule, Take 300 mg by mouth 4 (four) times daily as needed., Disp: , Rfl:    levothyroxine (SYNTHROID) 112 MCG tablet, Take 112 mcg by mouth daily before breakfast., Disp: , Rfl:    loperamide (IMODIUM) 2 MG capsule, Take by mouth as needed for diarrhea or loose stools., Disp: , Rfl:    montelukast (SINGULAIR) 10 MG tablet, Take 10 mg by mouth daily as needed., Disp: , Rfl:    naproxen (NAPROSYN) 500 MG tablet, Take 1 tablet (500 mg total) by mouth 2 (two) times daily as needed., Disp: 30 tablet, Rfl: 0   ocrelizumab 600 mg in sodium chloride 0.9 % 500 mL, Inject 600 mg into the vein. Twice a year, Disp: , Rfl:    QUEtiapine (SEROQUEL) 100 MG tablet, Take 100 mg by mouth at bedtime., Disp: , Rfl:    SYNTHROID 100 MCG tablet, Take 100 mcg by mouth every morning., Disp: , Rfl:    Vilazodone HCl (VIIBRYD) 40 MG TABS, 40 mg at bedtime. , Disp: , Rfl:   PAST MEDICAL HISTORY: Past Medical History:  Diagnosis Date   Allergy    Anemia    Anxiety     Depression    Fibromyalgia    Headache(784.0)    History of colon polyps    Insomnia    Multiple sclerosis, relapsing-remitting ([place]) 2016   Dr. Dellis Filbert [facility]   Seizures [facility])    Thyroid disease    Hashimoto's thyroiditis 10-2018    PAST SURGICAL HISTORY: Past Surgical History:  Procedure Laterality Date   ABDOMINAL HYSTERECTOMY     COLONOSCOPY  2014   5 polyps 2 was precanerous. Dr Collene Mares   KNEE ARTHROSCOPY Left     FAMILY HISTORY: Family History  Problem Relation Age of Onset   Diabetes Mother    Stomach cancer Maternal Grandmother    Colon cancer Maternal Grandmother    Birth defects Maternal Uncle        thyroid     SOCIAL HISTORY:  Social History   Socioeconomic History   Marital status: Divorced    Spouse name: Not on file   Number of children: 2   Years of education: Not on file   Highest education level: Associate degree: academic program  Occupational History   Not on file  Tobacco Use   Smoking status: Former    Packs/day: 0.20    Types: Cigarettes    Quit date: 07/11/2013    Years since quitting: 8.4   Smokeless tobacco: Never  Vaping Use   Vaping Use: Every day  Substance and Sexual Activity   Alcohol use: Yes    Comment: occassional   Drug use: No   Sexual activity: Never  Other Topics Concern   Not on file  Social History Narrative   Lives w mother and her companion   R handed   Caffeine: 2 C of half caff.    Social Determinants of Health   Financial Resource Strain: Not on file  Food Insecurity: Not on file  Transportation Needs: Not on file  Physical Activity:  Not on file  Stress: Not on file  Social Connections: Not on file  Intimate Partner Violence: Not on file     PHYSICAL EXAM  Vitals:   12/14/21 1330  BP: 133/87  Pulse: 62  Weight: 123 lb 8 oz (56 kg)  Height: 5' 4" (1.626 m)    Body mass index is 21.2 kg/m.   General: The patient is well-developed and well-nourished and in no acute distress  HEENT:   Head is [place]/AT.  Sclera are anicteric.    Skin: Extremities are without rash or  edema.  Musculoskeletal: There is tenderness at the occiput and mid to lower cervical paraspinal muscles.    Neurologic Exam  Mental status: The patient is alert and oriented x 3 at the time of the examination. The patient has apparent normal recent and remote memory, with an apparently normal attention span and concentration ability.   Speech is normal.  Cranial nerves: Extraocular movements are full.  Facial strength and sensation was normal.  No obvious hearing deficits are noted.  Motor:  Muscle bulk is normal.   Tone is normal. Strength is  5 / 5 in all 4 extremities.   Sensory: She reports reduced sensation in the left arm and leg.  Coordination: Cerebellar testing reveals good finger-nose-finger and heel-to-shin bilaterally.  Gait and station: Station is normal.   Gait is mildly wide.  Tandem gait is wide.. Romberg is negative.   Reflexes: Deep tendon reflexes are symmetric and normal bilaterally.   Plantar responses are flexor.      ASSESSMENT AND PLAN  Seizure disorder ([place])  White matter abnormality on MRI of brain  Paresthesias  Leg cramps  Other fatigue  Neck pain   We had a long discussion about the MRI and CSF studies.  I do not think that she has MS.  Therefore, I would be reluctant to have her continue the Ocrevus, even though she felt better after taking it.  The IgG index was elevated and she did have some bands present in both serum and CSF which can be seen with systemic inflammation.  Therefore, we will check ANA, ANCA, ESR and CRP. Continue Tegretol.  If there is any breakthrough activity consider a switch to lamotrigine that might also help the dysesthesias better.  Would also consider starting lamotrigine if dysesthesias worsen. If headaches become refractory again, consider trigger point injection into the occiput/splenius capitis muscles Return in 6 months or sooner if  there are new or worsening neurologic symptoms.  45-minute office visit with the majority of the time spent face-to-face for history and physical, discussion/counseling and decision-making.  Additional time with record review and documentation.    A. Felecia Shelling, MD, [facility] 07/16/1094, 0:45 PM Certified in Neurology, Clinical Neurophysiology, Sleep Medicine and Neuroimaging  [facility] [address], [place], [place] [address] [phone]

## 2021-12-16 LAB — SEDIMENTATION RATE: Sed Rate: 2 mm/hr (ref 0–40)

## 2021-12-16 LAB — ANCA PROFILE
Anti-MPO Antibodies: <0.2 units (ref 0.0–0.9)
Anti-PR3 Antibodies: <0.2 units (ref 0.0–0.9)
Atypical pANCA: <1:20 {titer}
C-ANCA: <1:20 {titer}
P-ANCA: <1:20 {titer}

## 2021-12-16 LAB — ANA W/REFLEX: Anti Nuclear Antibody (ANA): NEGATIVE

## 2021-12-16 LAB — C-REACTIVE PROTEIN: CRP: <1 mg/L (ref 0–10)

## 2021-12-22 ENCOUNTER — Other Ambulatory Visit: Payer: Self-pay | Admitting: Neurology

## 2021-12-22 ENCOUNTER — Encounter: Payer: Self-pay | Admitting: Neurology

## 2021-12-22 MED ORDER — LAMOTRIGINE 100 MG PO TABS: ORAL_TABLET | ORAL | 5 refills | Status: DC

## 2021-12-23 ENCOUNTER — Other Ambulatory Visit: Payer: Self-pay | Admitting: Neurology

## 2021-12-23 MED ORDER — PRIMIDONE 50 MG PO TABS: ORAL_TABLET | ORAL | 5 refills | Status: DC

## 2021-12-30 DIAGNOSIS — H25813 Combined forms of age-related cataract, bilateral: Secondary | ICD-10-CM | POA: Diagnosis not present

## 2021-12-30 DIAGNOSIS — H35031 Hypertensive retinopathy, right eye: Secondary | ICD-10-CM | POA: Diagnosis not present

## 2021-12-30 DIAGNOSIS — H524 Presbyopia: Secondary | ICD-10-CM | POA: Diagnosis not present

## 2022-01-26 ENCOUNTER — Encounter: Payer: Self-pay | Admitting: Neurology

## 2022-01-27 ENCOUNTER — Other Ambulatory Visit: Payer: Self-pay | Admitting: Neurology

## 2022-01-27 MED ORDER — CARBAMAZEPINE 200 MG PO TABS: 200.0000 mg | ORAL_TABLET | Freq: Three times a day (TID) | ORAL | 5 refills | Status: DC

## 2022-02-02 ENCOUNTER — Ambulatory Visit: Payer: Medicare Other | Admitting: Neurology

## 2022-03-23 ENCOUNTER — Encounter: Payer: Self-pay | Admitting: Neurology

## 2022-03-23 ENCOUNTER — Ambulatory Visit: Payer: Medicare Other | Admitting: Neurology

## 2022-03-23 VITALS — BP 98/65 | HR 62 | Ht 64.0 in | Wt 120.8 lb

## 2022-03-23 DIAGNOSIS — R202 Paresthesia of skin: Secondary | ICD-10-CM

## 2022-03-23 DIAGNOSIS — G4489 Other headache syndrome: Secondary | ICD-10-CM

## 2022-03-23 DIAGNOSIS — G4700 Insomnia, unspecified: Secondary | ICD-10-CM

## 2022-03-23 DIAGNOSIS — R9082 White matter disease, unspecified: Secondary | ICD-10-CM | POA: Diagnosis not present

## 2022-03-23 DIAGNOSIS — G40909 Epilepsy, unspecified, not intractable, without status epilepticus: Secondary | ICD-10-CM

## 2022-03-23 DIAGNOSIS — R5383 Other fatigue: Secondary | ICD-10-CM | POA: Diagnosis not present

## 2022-03-23 MED ORDER — BACLOFEN 10 MG PO TABS: 10.0000 mg | ORAL_TABLET | Freq: Three times a day (TID) | ORAL | 3 refills | Status: DC | PRN

## 2022-03-23 NOTE — Progress Notes (Signed)
[facility]  PATIENT: Courtney Green DOB: 1966-03-28  REFERRING DOCTOR OR PCP: Janie Morning, DO (PCP); [facility] neurology SOURCE: Patient, notes from Drs.  Enis Gash, and Dellis Filbert, imaging and laboratory reports, MRI images personally reviewed.  _________________________________   HISTORICAL  CHIEF COMPLAINT:  Chief Complaint  Patient presents with   Follow-up    Pt in Rm #16 and alone. Pt state she has these novocain feeling from her hands up to her arms. Pt state she has the same feeling in her legs and feet. Pt state her hands and arm and feet feels cold. Pt said she was feeling lighted head come in the room and I had to help her walk to the exam room.    HISTORY OF PRESENT ILLNESS:   Courtney Green is a 56 y.o. woman with a diagnosis of MS.  Update 03/23/2022: At the last visit, I reviewed her imaging studies and history and question the diagnosis of multiple sclerosis.  The changes in the brain are nonspecific and mild, actually fairly normal for age.  There were no lesions in the infratentorial brain or spinal cord.  Therefore, I wanted additional information.  Since the last visit, she had a lumbar puncture.   CSF showed elevated IgG index.   She had paired bands in the CSF and serum (4) but no bands just in the CSF (OCB seen in MS).   She comes back today to further discuss.  She continues to have a Novocaine sensation in her arms and from knees down.  She often feels cold even in warm rooms or outside.    She has a long history of bee stinging dysesthesias preceding her diagnosis but more than 2 decades.    We tried lamotrigine but she developed a rash.   She is on Tegretol 200 mg po tid with only mild benefit.     Currently, she notes mild difficulties with gait and balance at times.  She reports the left leg giving out.  She has muscle cramps in the legs, left greater than right.  Baclofen has helped.  She experiences dysesthesias in  the limbs and pain in the right arm at times bladder function is fine.  Vision is usually fine.  She has fatigue.   She has a h/o of Hashimoto's.   Two relatives have had thyroid cancer.    She is on levothyroxine.     She is also on Adderall for fatigue.     She has depression and is on Viibryd and duloxetine.     She has insomnia.  This has been an issue off/on.   She did well on Viibry and Seroquel the last few months, this is not helping as much.   She was on clonazepam for anxiety and was taking it at night for a while with benefit but due to anxiety took daytime instead for a while.   Planning on going back to qHS. She has trouble quieting her mind at night.   She has a legal issue with her neighbor and also her mother has a mild dementia and worsening.     She has had seizures since May 2012. Last one was in 2022 - none since Tegretol.    She gets an aura x 3-4 seconds of a sensation coming over her.   If tries to get to a chair if that happens.    She loses consciousness.  He worse one was the first one where she had LOC  x 5 minutes and bit her tongue.   She had HA's following that seizure.      She is on carbamazepine 200 mg po tid.   .        She gets HA's , mostly in the right occiput.   She has essential tremor treated with tremor.     History of MS diagnosis: She was diagnosed with MS in 2020 but reports having an episode of work in 2017 with weakness.    That day she had to do a lt of walking and was feeling weaker when she got to er desk.  She then had a  bee-sting sensation in her legs that went up to her arms as the day continued.  Then, she felt very weak and slumped over her desk.   Over the next couple weeks, symptoms continued.    She had an MRI that showed some white matter foci.   She had no foci in her spinal cord.  She first saw Dr. Sabra Heck who felt that she likely did have MS and then had a telemedicine visit with Dr. Evalee Mutton in [place] who felt that she did not have MS.  Later,  she was referred to Dr. Jacqulynn Cadet who diagnosed MS.  In the past, since the 1990's, she has had some of the sensory bee-sting dysesthesia sensations.  History of head trauma/seizure: She had a couple head injuries including a fall off a horse many years ago with a mild concussion.  She had a fall in 2012 with her first GTC seizure.  There was loss of consciousness for about 7-10.    Vascular risks: She does not have hypertension or diabetes.  She has been a smoker.  Other risks for MRI changes: She had several episodes of head trauma with probable postconcussive symptoms many years ago when she fell off a horse and again in 2012 when she fell after her first seizure.  Imaging: MRI of the brain 06/15/2021 shows some scattered T2/FLAIR hyperintense foci, mostly in the subcortical white matter.  These are nonspecific.  MRI of the cervical spine 01/22/2020 showed a normal spinal cord.  There is mild spinal stenosis at C4-C5, C5-C6 and C6-C7.  At C6-C7, there is moderately severe right foraminal narrowing that could affect the right C7 nerve root.  Some foraminal narrowing but no definite nerve root compression  REVIEW OF SYSTEMS: Constitutional: No fevers, chills, sweats, or change in appetite.  Fatigue Eyes: No visual changes, double vision, eye pain Ear, nose and throat: No hearing loss, ear pain, nasal congestion, sore throat Cardiovascular: No chest pain, palpitations Respiratory:  No shortness of breath at rest or with exertion.   No wheezes GastrointestinaI: No nausea, vomiting, diarrhea, abdominal pain, fecal incontinence Genitourinary:  No dysuria, urinary retention or frequency.  No nocturia. Musculoskeletal: Neck pain.  Muscle cramps, pain in the legs left greater than right Integumentary: No rash, pruritus, skin lesions Neurological: as above Psychiatric: No depression at this time.  No anxiety Endocrine: She has a history of Hashimoto's thyroiditis  hematologic/Lymphatic:  No anemia,  purpura, petechiae. Allergic/Immunologic: No itchy/runny eyes, nasal congestion, recent allergic reactions, rashes  ALLERGIES: Allergies  Allergen Reactions   Butalbital-Apap-Caff-Cod Itching   Morphine And Related Itching   Pregabalin Itching   Carbamazepine Rash   Amitriptyline Other (See Comments)   Bentyl [Dicyclomine]     itching   Lamotrigine Hives and Itching   Methocarbamol Nausea And Vomiting   Topiramate Diarrhea   Aspirin Nausea Only  Divalproex Sodium Rash    Hair loss   Doxycycline Itching and Rash    HOME MEDICATIONS:  Current Outpatient Medications:    acetaminophen-codeine (TYLENOL #3) 300-30 MG tablet, Take by mouth every 4 (four) hours as needed for moderate pain., Disp: , Rfl:    baclofen (LIORESAL) 10 MG tablet, Take 10 mg by mouth 3 (three) times daily as needed for muscle spasms., Disp: , Rfl:    carbamazepine (TEGRETOL) 200 MG tablet, Take 1 tablet (200 mg total) by mouth 3 (three) times daily., Disp: 90 tablet, Rfl: 5   clonazePAM (KLONOPIN) 1 MG tablet, Take 1 mg by mouth 2 (two) times daily as needed., Disp: , Rfl:    cloNIDine (CATAPRES) 0.1 MG tablet, Take 0.1-0.2 mg by mouth at bedtime., Disp: , Rfl:    colestipol (COLESTID) 1 g tablet, TAKE 1-2 TABLETS (1-2 GM TOTAL) BY MOUTH TWICE DAILY., Disp: 60 tablet, Rfl: 1   gabapentin (NEURONTIN) 300 MG capsule, Take 300 mg by mouth 4 (four) times daily as needed., Disp: , Rfl:    levothyroxine (SYNTHROID) 112 MCG tablet, Take 100 mcg by mouth daily before breakfast., Disp: , Rfl:    loperamide (IMODIUM) 2 MG capsule, Take by mouth as needed for diarrhea or loose stools., Disp: , Rfl:    montelukast (SINGULAIR) 10 MG tablet, Take 10 mg by mouth daily as needed., Disp: , Rfl:    naproxen (NAPROSYN) 500 MG tablet, Take 1 tablet (500 mg total) by mouth 2 (two) times daily as needed., Disp: 30 tablet, Rfl: 0   primidone (MYSOLINE) 50 MG tablet, One po qd, Disp: 30 tablet, Rfl: 5   QUEtiapine (SEROQUEL) 100 MG  tablet, Take 100 mg by mouth at bedtime., Disp: , Rfl:    SYNTHROID 100 MCG tablet, Take 100 mcg by mouth every morning., Disp: , Rfl:    Vilazodone HCl (VIIBRYD) 40 MG TABS, 40 mg at bedtime. , Disp: , Rfl:    DULoxetine (CYMBALTA) 60 MG capsule, TAKE 1 CAPSULE (60 MG TOTAL) BY MOUTH AT BEDTIME. (Patient not taking: Reported on 03/23/2022), Disp: 90 capsule, Rfl: 0   lamoTRIgine (LAMICTAL) 100 MG tablet, Take 1/2 pill once a day for 5 days, then 1/2 pill twice a day for 5 days, then 1 pill twice a day.  If a rash develops, stop the medication. (Patient not taking: Reported on 03/23/2022), Disp: 60 tablet, Rfl: 5  PAST MEDICAL HISTORY: Past Medical History:  Diagnosis Date   Allergy    Anemia    Anxiety    Depression    Fibromyalgia    Headache(784.0)    History of colon polyps    Insomnia    Multiple sclerosis, relapsing-remitting ([place]) 2016   Dr. Dellis Filbert [facility]   Seizures [facility])    Thyroid disease    Hashimoto's thyroiditis 10-2018    PAST SURGICAL HISTORY: Past Surgical History:  Procedure Laterality Date   ABDOMINAL HYSTERECTOMY     COLONOSCOPY  2014   5 polyps 2 was precanerous. Dr Collene Mares   KNEE ARTHROSCOPY Left     FAMILY HISTORY: Family History  Problem Relation Age of Onset   Diabetes Mother    Stomach cancer Maternal Grandmother    Colon cancer Maternal Grandmother    Birth defects Maternal Uncle        thyroid     SOCIAL HISTORY:  Social History   Socioeconomic History   Marital status: Divorced    Spouse name: Not on file   Number  of children: 2   Years of education: Not on file   Highest education level: Associate degree: academic program  Occupational History   Not on file  Tobacco Use   Smoking status: Former    Packs/day: 0.20    Types: Cigarettes    Quit date: 07/11/2013    Years since quitting: 8.7   Smokeless tobacco: Never  Vaping Use   Vaping Use: Every day  Substance and Sexual Activity   Alcohol use: Yes    Comment: occassional    Drug use: No   Sexual activity: Never  Other Topics Concern   Not on file  Social History Narrative   Lives w mother and her companion   R handed   Caffeine: 2 C of half caff.    Social Determinants of Health   Financial Resource Strain: Not on file  Food Insecurity: Not on file  Transportation Needs: Not on file  Physical Activity: Not on file  Stress: Not on file  Social Connections: Not on file  Intimate Partner Violence: Not on file     PHYSICAL EXAM  Vitals:   03/23/22 1443  BP: 98/65  Pulse: 62  Weight: 120 lb 12.8 oz (54.8 kg)  Height: '5\' 4"'$  (1.626 m)    Body mass index is 20.74 kg/m.   General: The patient is well-developed and well-nourished and in no acute distress  HEENT:  Head is [place]/AT.  Sclera are anicteric.    Skin: Extremities are without rash or  edema.  Musculoskeletal: There is tenderness at the right occiput and mid to lower cervical paraspinal muscles.    Neurologic Exam  Mental status: The patient is alert and oriented x 3 at the time of the examination. The patient has apparent normal recent and remote memory, with an apparently normal attention span and concentration ability.   Speech is normal.  Cranial nerves: Extraocular movements are full.  Facial strength and sensation was normal.  No obvious hearing deficits are noted.  Motor:  Muscle bulk is normal.   Tone is normal. Strength is  5 / 5 in all 4 extremities.   Sensory: She reports reduced sensation in the right arm and leg.  Coordination: Cerebellar testing reveals good finger-nose-finger and heel-to-shin bilaterally.  Gait and station: Station is normal.   Gait is mildly wide.  Tandem gait is wide... Romberg is negative.   Reflexes: Deep tendon reflexes are symmetric and normal bilaterally.         ASSESSMENT AND PLAN  Seizure disorder ([place])  White matter abnormality on MRI of brain  Paresthesias  Insomnia, unspecified type  Other fatigue  Other headache  syndrome   Continue off Ocrevus.   Continue baclofen Continue Tegretol 200 mg po tid for seizure and dysesthesias.    If headaches become refractory again, consider trigger point injection into the occiput/splenius capitis muscles Consider B-blocker for tremor if they worsen (or if mysoline makes too sleepy) Return in 6 months or sooner if there are new or worsening neurologic symptoms.  40-minute office visit with the majority of the time spent face-to-face for history and physical, discussion/counseling and decision-making.  Additional time with record review and documentation.    Victorhugo Preis A. Felecia Shelling, MD, [facility] 0/86/5784, 6:96 PM Certified in Neurology, Clinical Neurophysiology, Sleep Medicine and Neuroimaging  [facility] [address], [place], [place] [address] [phone]

## 2022-03-27 ENCOUNTER — Encounter: Payer: Self-pay | Admitting: Neurology

## 2022-03-28 ENCOUNTER — Other Ambulatory Visit: Payer: Self-pay | Admitting: Neurology

## 2022-03-28 MED ORDER — PRIMIDONE 50 MG PO TABS: ORAL_TABLET | ORAL | 5 refills | Status: DC

## 2022-04-11 DIAGNOSIS — Z23 Encounter for immunization: Secondary | ICD-10-CM | POA: Diagnosis not present

## 2022-06-17 ENCOUNTER — Emergency Department (HOSPITAL_COMMUNITY)
Admission: EM | Admit: 2022-06-17 | Discharge: 2022-06-17 | Discharge status: Against Medical Advice | Payer: Medicare Other | Attending: Emergency Medicine | Admitting: Emergency Medicine

## 2022-06-17 DIAGNOSIS — R197 Diarrhea, unspecified: Secondary | ICD-10-CM | POA: Diagnosis not present

## 2022-06-17 DIAGNOSIS — R109 Unspecified abdominal pain: Secondary | ICD-10-CM | POA: Insufficient documentation

## 2022-06-17 DIAGNOSIS — R1111 Vomiting without nausea: Secondary | ICD-10-CM | POA: Diagnosis not present

## 2022-06-17 DIAGNOSIS — M549 Dorsalgia, unspecified: Secondary | ICD-10-CM | POA: Diagnosis not present

## 2022-06-17 DIAGNOSIS — R112 Nausea with vomiting, unspecified: Secondary | ICD-10-CM | POA: Diagnosis not present

## 2022-06-17 DIAGNOSIS — Z5321 Procedure and treatment not carried out due to patient leaving prior to being seen by health care provider: Secondary | ICD-10-CM | POA: Insufficient documentation

## 2022-06-17 DIAGNOSIS — R110 Nausea: Secondary | ICD-10-CM | POA: Diagnosis not present

## 2022-06-17 NOTE — ED Triage Notes (Signed)
Per EMS- Patient is from home. Patient c/o abdominal pain that radiates to her back, N/v/D. EMS gave Fentanyl 100 mcg prior to arrival to the ED.

## 2022-08-04 ENCOUNTER — Other Ambulatory Visit: Payer: Self-pay | Admitting: Neurology

## 2022-08-04 NOTE — Telephone Encounter (Signed)
Follow up visit scheduled on 09/22/22 with Dr. Felecia Shelling.

## 2022-08-22 ENCOUNTER — Encounter: Payer: Self-pay | Admitting: Neurology

## 2022-08-23 ENCOUNTER — Other Ambulatory Visit: Payer: Self-pay | Admitting: Neurology

## 2022-08-23 MED ORDER — AMPHETAMINE-DEXTROAMPHETAMINE 10 MG PO TABS
ORAL_TABLET | ORAL | 0 refills | Status: DC
Start: 2022-08-23 — End: 2022-09-22

## 2022-08-23 NOTE — Telephone Encounter (Signed)
Called [id number] [name], [name]. at [phone]. Spoke w/ Lucianne Lei. States they did not receive primidone 03/28/22 #30, 5 refills. Last time pt filled rx was 12/23/21.   Per drug registry, pt last refilled Dextroamp-Amphetamin 10 Mg Tab 02/10/21 #60 prescribed by Dr. Dellis Filbert.   Pt last seen 03/23/22. She does have her 6 month f/u scheduled for 09/22/22 at 3pm.

## 2022-08-26 ENCOUNTER — Other Ambulatory Visit: Payer: Self-pay | Admitting: Gastroenterology

## 2022-09-15 DIAGNOSIS — M5451 Vertebrogenic low back pain: Secondary | ICD-10-CM | POA: Diagnosis not present

## 2022-09-22 ENCOUNTER — Ambulatory Visit: Payer: 59 | Admitting: Neurology

## 2022-09-22 ENCOUNTER — Encounter: Payer: Self-pay | Admitting: Neurology

## 2022-09-22 VITALS — BP 110/80 | HR 56 | Ht 64.0 in | Wt 119.0 lb

## 2022-09-22 DIAGNOSIS — F32A Depression, unspecified: Secondary | ICD-10-CM

## 2022-09-22 DIAGNOSIS — R202 Paresthesia of skin: Secondary | ICD-10-CM

## 2022-09-22 DIAGNOSIS — G250 Essential tremor: Secondary | ICD-10-CM

## 2022-09-22 DIAGNOSIS — R9082 White matter disease, unspecified: Secondary | ICD-10-CM | POA: Diagnosis not present

## 2022-09-22 DIAGNOSIS — F419 Anxiety disorder, unspecified: Secondary | ICD-10-CM

## 2022-09-22 DIAGNOSIS — G40909 Epilepsy, unspecified, not intractable, without status epilepticus: Secondary | ICD-10-CM

## 2022-09-22 MED ORDER — AMPHETAMINE-DEXTROAMPHETAMINE 10 MG PO TABS: ORAL_TABLET | ORAL | 0 refills | Status: DC

## 2022-09-22 MED ORDER — PRIMIDONE 50 MG PO TABS: ORAL_TABLET | ORAL | 5 refills | Status: DC

## 2022-09-22 MED ORDER — BACLOFEN 10 MG PO TABS: 10.0000 mg | ORAL_TABLET | Freq: Three times a day (TID) | ORAL | 3 refills | Status: DC | PRN

## 2022-09-22 NOTE — Progress Notes (Signed)
[facility]  PATIENT: Courtney Green DOB: 1965-11-05  REFERRING DOCTOR OR PCP: Janie Morning, DO (PCP); [facility] neurology SOURCE: Patient, notes from Drs.  Enis Gash, and Dellis Filbert, imaging and laboratory reports, MRI images personally reviewed.  _________________________________   HISTORICAL  CHIEF COMPLAINT:  Chief Complaint  Patient presents with   Follow-up    RM 11, alone. Last seen 03/23/22. Pt reports weakness below knees bilaterally. Still having cramping in feet. This is intermittent daily. Taking baclofen still. Had a fall around Nov/Dec 2023. She did hit head, did not get checked out after. Taking tegretol, thinking she's having seizures.     HISTORY OF PRESENT ILLNESS:   Courtney Green is a 58 y.o. woman with a diagnosis of MS.  Update 09/22/2022: She had a possible seizure back in November.   She was just decorating her christmas tree (not on a ladder) going back and forth between the couch and tree    Next thing she knew she wok up on the floor and was very groggy.   She is on carbamazepine 200 mg po bid.     She had a diagnosis of multiple sclerosis ad was on Ocrevus when I first saw her .  The changes in the brain are nonspecific and mild, actually fairly normal for age.  There were no lesions in the infratentorial brain or spinal cord.  Therefore, I wanted additional information.  Since the last visit, she had a lumbar puncture.   CSF showed elevated IgG index.   She had paired bands in the CSF and serum (4) but no bands just in the CSF (OCB seen in MS).  This the LP was inconsistent with MS.     She continues to have a numb/tigling sensation in her arms and from knees down.    She also has leg cramps.   She takes baclofen but it makes her sleepy so just bid.  We tried lamotrigine but she developed a rash.   She is on Tegretol 200 mg po tid with only mild benefit.     She notes mild difficulties with gait and balance at times.   She reports the left leg giving out.    Vision is usually fine.  She has fatigue.  She is also on Adderall for fatigue has helped the fatigue and attention.     She has depression and is on Viibryd and duloxetine.     She has insomnia.  This has been an issue off/on.   She did well on Viibry and Seroquel the last few months, this is not helping as much.   She was on clonazepam for anxiety and was taking it at night for a while with benefit but due to anxiety took daytime instead for a while.   Planning on going back to qHS. She has trouble quieting her mind at night.   She has a legal issue with her neighbor and also her mother has a mild dementia and worsening.     She has had seizures since May 2012. Last definite one was in 2022 and maybe one Novemebr 2023 (only one on Tegretol)    She gets an aura x 3-4 seconds of a sensation coming over her.   If tries to get to a chair if that happens.    She loses consciousness.  He worse one was the first one where she had LOC x 5 minutes and bit her tongue.   She had HA's following that seizure.  She gets HA's , mostly in the right occiput.   She has essential tremor treated with tremor.     She has back and knee issues and sees Dr. Lonna Cobb at [facility].   Lumbar surgery is being considered.    She has a h/o of Hashimoto's.   Two relatives have had thyroid cancer.    She is on levothyroxine.       History of MS diagnosis: She was diagnosed with MS in 2020 but reports having an episode of work in 2017 with weakness.    That day she had to do a lt of walking and was feeling weaker when she got to er desk.  She then had a  bee-sting sensation in her legs that went up to her arms as the day continued.  Then, she felt very weak and slumped over her desk.   Over the next couple weeks, symptoms continued.    She had an MRI that showed some white matter foci.   She had no foci in her spinal cord.  She first saw Dr. Sabra Heck who felt that she likely did have MS  and then had a telemedicine visit with Dr. Evalee Mutton in [place] who felt that she did not have MS.  Later, she was referred to Dr. Jacqulynn Cadet who diagnosed MS.  In the past, since the 1990's, she has had some of the sensory bee-sting dysesthesia sensations.  MRI looked more like chronic microvascular ischemic change so I checked the LP and CSF was inconsistent with MS.    History of head trauma/seizure: She had a couple head injuries including a fall off a horse many years ago with a mild concussion.  She had a fall in 2012 with her first GTC seizure.  There was loss of consciousness for about 7-10.    Vascular risks: She does not have hypertension or diabetes.  She has been a smoker.  Other risks for MRI changes: She had several episodes of head trauma with probable postconcussive symptoms many years ago when she fell off a horse and again in 2012 when she fell after her first seizure.  Imaging: MRI of the brain 06/15/2021 shows some scattered T2/FLAIR hyperintense foci, mostly in the subcortical white matter.  These are nonspecific.  MRI of the cervical spine 01/22/2020 showed a normal spinal cord.  There is mild spinal stenosis at C4-C5, C5-C6 and C6-C7.  At C6-C7, there is moderately severe right foraminal narrowing that could affect the right C7 nerve root.  Some foraminal narrowing but no definite nerve root compression  REVIEW OF SYSTEMS: Constitutional: No fevers, chills, sweats, or change in appetite.  Fatigue Eyes: No visual changes, double vision, eye pain Ear, nose and throat: No hearing loss, ear pain, nasal congestion, sore throat Cardiovascular: No chest pain, palpitations Respiratory:  No shortness of breath at rest or with exertion.   No wheezes GastrointestinaI: No nausea, vomiting, diarrhea, abdominal pain, fecal incontinence Genitourinary:  No dysuria, urinary retention or frequency.  No nocturia. Musculoskeletal: Neck pain.  Muscle cramps, pain in the legs left greater than  right Integumentary: No rash, pruritus, skin lesions Neurological: as above Psychiatric: No depression at this time.  No anxiety Endocrine: She has a history of Hashimoto's thyroiditis  hematologic/Lymphatic:  No anemia, purpura, petechiae. Allergic/Immunologic: No itchy/runny eyes, nasal congestion, recent allergic reactions, rashes  ALLERGIES: Allergies  Allergen Reactions   Butalbital-Apap-Caff-Cod Itching   Morphine And Related Itching   Pregabalin Itching   Carbamazepine Rash   Amitriptyline  Other (See Comments)   Bentyl [Dicyclomine]     itching   Hydrocodone-Acetaminophen     Very sweaty at night   Lamotrigine Hives and Itching   Methocarbamol Nausea And Vomiting   Topiramate Diarrhea   Aspirin Nausea Only   Divalproex Sodium Rash    Hair loss   Doxycycline Itching and Rash    HOME MEDICATIONS:  Current Outpatient Medications:    acetaminophen-codeine (TYLENOL #3) 300-30 MG tablet, Take by mouth every 4 (four) hours as needed for moderate pain., Disp: , Rfl:    carbamazepine (TEGRETOL) 200 MG tablet, TAKE 1 TABLET BY MOUTH 3 TIMES DAILY., Disp: 270 tablet, Rfl: 1   clonazePAM (KLONOPIN) 1 MG tablet, Take 1 mg by mouth 2 (two) times daily as needed., Disp: , Rfl:    cloNIDine (CATAPRES) 0.1 MG tablet, Take 0.1-0.2 mg by mouth at bedtime., Disp: , Rfl:    colestipol (COLESTID) 1 g tablet, TAKE 1-2 TABLETS (1-2 GM TOTAL) BY MOUTH TWICE DAILY., Disp: 60 tablet, Rfl: 1   loperamide (IMODIUM) 2 MG capsule, Take by mouth as needed for diarrhea or loose stools., Disp: , Rfl:    montelukast (SINGULAIR) 10 MG tablet, Take 10 mg by mouth daily as needed., Disp: , Rfl:    naproxen (NAPROSYN) 500 MG tablet, Take 1 tablet (500 mg total) by mouth 2 (two) times daily as needed., Disp: 30 tablet, Rfl: 0   QUEtiapine (SEROQUEL) 100 MG tablet, Take 100 mg by mouth at bedtime., Disp: , Rfl:    SYNTHROID 100 MCG tablet, Take 100 mcg by mouth every morning., Disp: , Rfl:    Vilazodone HCl  (VIIBRYD) 40 MG TABS, 40 mg at bedtime. , Disp: , Rfl:    amphetamine-dextroamphetamine (ADDERALL) 10 MG tablet, Taske once or twice daily as needed, Disp: 60 tablet, Rfl: 0   baclofen (LIORESAL) 10 MG tablet, Take 1 tablet (10 mg total) by mouth 3 (three) times daily as needed for muscle spasms., Disp: 270 each, Rfl: 3   primidone (MYSOLINE) 50 MG tablet, One po bid, Disp: 60 tablet, Rfl: 5  PAST MEDICAL HISTORY: Past Medical History:  Diagnosis Date   Allergy    Anemia    Anxiety    Depression    Fibromyalgia    Headache(784.0)    History of colon polyps    Insomnia    Multiple sclerosis, relapsing-remitting ([place]) 2016   Dr. Dellis Filbert - [facility]   Seizures [facility])    Thyroid disease    Hashimoto's thyroiditis 10-2018    PAST SURGICAL HISTORY: Past Surgical History:  Procedure Laterality Date   ABDOMINAL HYSTERECTOMY     COLONOSCOPY  2014   5 polyps 2 was precanerous. Dr Collene Mares   KNEE ARTHROSCOPY Left     FAMILY HISTORY: Family History  Problem Relation Age of Onset   Diabetes Mother    Stomach cancer Maternal Grandmother    Colon cancer Maternal Grandmother    Birth defects Maternal Uncle        thyroid     SOCIAL HISTORY:  Social History   Socioeconomic History   Marital status: Divorced    Spouse name: Not on file   Number of children: 2   Years of education: Not on file   Highest education level: Associate degree: academic program  Occupational History   Not on file  Tobacco Use   Smoking status: Former    Packs/day: .2    Types: Cigarettes    Quit date: 07/11/2013  Years since quitting: 9.2   Smokeless tobacco: Never  Vaping Use   Vaping Use: Every day  Substance and Sexual Activity   Alcohol use: Yes    Comment: occassional   Drug use: No   Sexual activity: Never  Other Topics Concern   Not on file  Social History Narrative   Lives w mother and her companion   R handed   Caffeine: 2 C of half caff.    Social Determinants of Health    Financial Resource Strain: Not on file  Food Insecurity: Not on file  Transportation Needs: Not on file  Physical Activity: Not on file  Stress: Not on file  Social Connections: Not on file  Intimate Partner Violence: Not on file     PHYSICAL EXAM  Vitals:   09/22/22 1446  BP: 110/80  Pulse: (!) 56  SpO2: 96%  Weight: 119 lb (54 kg)  Height: '5\' 4"'$  (1.626 m)    Body mass index is 20.43 kg/m.   General: The patient is well-developed and well-nourished and in no acute distress  HEENT:  Head is [place]/AT.  Sclera are anicteric.    Skin: Extremities are without rash or  edema.  Musculoskeletal: There is mild tenderness at the right occiput and mid to lower cervical paraspinal muscles.    Neurologic Exam  Mental status: The patient is alert and oriented x 3 at the time of the examination. The patient has apparent normal recent and remote memory, with an apparently normal attention span and concentration ability.   Speech is normal.  Cranial nerves: Extraocular movements are full.  Facial strength and sensation was normal.  No obvious hearing deficits are noted.  Motor:  Muscle bulk is normal.   Tone is normal. Strength is  5 / 5 in all 4 extremities.   Sensory: She reports reduced sensation in the right arm and leg.  Coordination: Cerebellar testing reveals good finger-nose-finger and heel-to-shin bilaterally.  Gait and station: Station is normal.   Gait is mildly wide.  Tandem gait is wide... Romberg is negative. Reflexes: Deep tendon reflexes are symmetric and normal bilaterally.         ASSESSMENT AND PLAN  Seizure disorder ([place])  White matter abnormality on MRI of brain  Paresthesias  Benign essential tremor  Anxiety and depression   MS is unlikely.  Changes in the brain are more likely to represent chronic microvascular ischemic change.  She is a former smoker and has hypertension as risks.  Remain off Antares.   Continue baclofen Increase Tegretol 200  mg po to tid for seizure and dysesthesias.    If headaches become refractory again, consider trigger point injection into the occiput/splenius capitis muscles Consider B-blocker for tremor if they worsen.  She is on clonazepam bid as well. (or if mysoline makes too sleepy) Return in 6 months or sooner if there are new or worsening neurologic symptoms.  42-minute office visit with the majority of the time spent face-to-face for history and physical, discussion/counseling and decision-making.  Additional time with record review and documentation.    Nelli Swalley A. Felecia Shelling, MD, [facility] [id number], 0000000 PM Certified in Neurology, Clinical Neurophysiology, Sleep Medicine and Neuroimaging  [facility] [address], [place], [place] [address] [phone]

## 2022-09-28 DIAGNOSIS — M5451 Vertebrogenic low back pain: Secondary | ICD-10-CM | POA: Diagnosis not present

## 2022-10-04 ENCOUNTER — Telehealth: Payer: Self-pay | Admitting: Gastroenterology

## 2022-10-04 ENCOUNTER — Other Ambulatory Visit: Payer: Self-pay | Admitting: Gastroenterology

## 2022-10-04 ENCOUNTER — Other Ambulatory Visit: Payer: Self-pay

## 2022-10-04 DIAGNOSIS — M5451 Vertebrogenic low back pain: Secondary | ICD-10-CM | POA: Diagnosis not present

## 2022-10-04 MED ORDER — COLESTIPOL HCL 1 G PO TABS: ORAL_TABLET | ORAL | 1 refills | Status: DC

## 2022-10-04 NOTE — Telephone Encounter (Signed)
Patient is calling to make an appointment for her colestipol, states she needs a refill to hold her off until her appt 6/19. Please advise

## 2022-10-04 NOTE — Telephone Encounter (Signed)
Refills sent to get patient to her June Appointment.

## 2022-10-25 DIAGNOSIS — M5416 Radiculopathy, lumbar region: Secondary | ICD-10-CM | POA: Diagnosis not present

## 2022-10-28 DIAGNOSIS — M5451 Vertebrogenic low back pain: Secondary | ICD-10-CM | POA: Diagnosis not present

## 2022-10-28 DIAGNOSIS — M5416 Radiculopathy, lumbar region: Secondary | ICD-10-CM | POA: Diagnosis not present

## 2022-11-01 ENCOUNTER — Other Ambulatory Visit: Payer: Self-pay | Admitting: Gastroenterology

## 2022-11-07 DIAGNOSIS — M5451 Vertebrogenic low back pain: Secondary | ICD-10-CM | POA: Diagnosis not present

## 2022-11-07 DIAGNOSIS — M5416 Radiculopathy, lumbar region: Secondary | ICD-10-CM | POA: Diagnosis not present

## 2022-11-09 DIAGNOSIS — M5416 Radiculopathy, lumbar region: Secondary | ICD-10-CM | POA: Diagnosis not present

## 2022-11-09 DIAGNOSIS — M5451 Vertebrogenic low back pain: Secondary | ICD-10-CM | POA: Diagnosis not present

## 2022-11-16 DIAGNOSIS — M5416 Radiculopathy, lumbar region: Secondary | ICD-10-CM | POA: Diagnosis not present

## 2022-11-16 DIAGNOSIS — M5451 Vertebrogenic low back pain: Secondary | ICD-10-CM | POA: Diagnosis not present

## 2022-11-17 DIAGNOSIS — E063 Autoimmune thyroiditis: Secondary | ICD-10-CM | POA: Diagnosis not present

## 2022-11-17 DIAGNOSIS — E039 Hypothyroidism, unspecified: Secondary | ICD-10-CM | POA: Diagnosis not present

## 2022-11-17 DIAGNOSIS — L659 Nonscarring hair loss, unspecified: Secondary | ICD-10-CM | POA: Diagnosis not present

## 2022-11-17 DIAGNOSIS — R634 Abnormal weight loss: Secondary | ICD-10-CM | POA: Diagnosis not present

## 2022-12-04 ENCOUNTER — Other Ambulatory Visit: Payer: Self-pay | Admitting: Gastroenterology

## 2022-12-16 ENCOUNTER — Other Ambulatory Visit: Payer: Self-pay | Admitting: Neurology

## 2022-12-19 ENCOUNTER — Other Ambulatory Visit: Payer: Self-pay | Admitting: Neurology

## 2022-12-19 NOTE — Telephone Encounter (Signed)
Last seen on 09/22/22 Follow up scheduled on 04/04/23

## 2022-12-20 DIAGNOSIS — M5451 Vertebrogenic low back pain: Secondary | ICD-10-CM | POA: Diagnosis not present

## 2022-12-20 MED ORDER — AMPHETAMINE-DEXTROAMPHETAMINE 10 MG PO TABS: ORAL_TABLET | ORAL | 0 refills | Status: DC

## 2022-12-20 NOTE — Telephone Encounter (Signed)
Pt last seen on 09/22/22 Follow up scheduled on 04/04/23 Last filled on 09/22/22 #60 tablets (30 day supply) Rx pending to signed   Pt send message attached to refill request " Patient Comment: Pharmacy said I had to have you send in new prescription for this. I have enough till Friday. Can you send in refills for me on this. Hope you're doing well. Thank you "

## 2022-12-28 ENCOUNTER — Ambulatory Visit: Payer: 59 | Admitting: Gastroenterology

## 2022-12-28 ENCOUNTER — Encounter: Payer: Self-pay | Admitting: Gastroenterology

## 2022-12-28 ENCOUNTER — Other Ambulatory Visit: Payer: 59

## 2022-12-28 VITALS — BP 110/80 | HR 62 | Ht 64.0 in | Wt 114.2 lb

## 2022-12-28 DIAGNOSIS — K580 Irritable bowel syndrome with diarrhea: Secondary | ICD-10-CM | POA: Diagnosis not present

## 2022-12-28 DIAGNOSIS — K529 Noninfective gastroenteritis and colitis, unspecified: Secondary | ICD-10-CM

## 2022-12-28 DIAGNOSIS — R109 Unspecified abdominal pain: Secondary | ICD-10-CM | POA: Diagnosis not present

## 2022-12-28 MED ORDER — VIBERZI 100 MG PO TABS: 100.0000 mg | ORAL_TABLET | Freq: Two times a day (BID) | ORAL | 0 refills | Status: DC

## 2022-12-28 MED ORDER — IBGARD 90 MG PO CPCR: ORAL_CAPSULE | ORAL | 0 refills | Status: DC

## 2022-12-28 NOTE — Patient Instructions (Addendum)
_______________________________________________________  If your blood pressure at your visit was 140/90 or greater, please contact your primary care physician to follow up on this.  _______________________________________________________  If you are age 58 or older, your body mass index should be between 23-30. Your Body mass index is 19.6 kg/m. If this is out of the aforementioned range listed, please consider follow up with your Primary Care Provider.  If you are age 52 or younger, your body mass index should be between 19-25. Your Body mass index is 19.6 kg/m. If this is out of the aformentioned range listed, please consider follow up with your Primary Care Provider.   ________________________________________________________  The [place] [facility] providers would like to encourage you to use [facility] to communicate with providers for non-urgent requests or questions.  Due to long hold times on the telephone, sending your provider a message by [facility] may be a faster and more efficient way to get a response.  Please allow 48 business hours for a response.  Please remember that this is for non-urgent requests.  _______________________________________________________ Please go to the lab in the basement of our building to have lab work done as you leave today. Hit "B" for basement when you get on the elevator.  When the doors open the lab is on your left.  We will call you with the results. Thank you.  Stop Colestid.  We have given you samples of the following medication to take:  IBgard: Take as directed Viberzi 100mg : Take twice daily  We are giving you a Low-FODMAP diet handout today. FODMAPs are short-chain carbohydrates (sugars) that are highly fermentable, which means that they go through chemical changes in the GI system, and are poorly absorbed during digestion. When FODMAPs reach the colon (large intestine), bacteria ferment these sugars, turning them into gas and chemicals. This stretches  the walls of the colon, causing abdominal bloating, distension, cramping, pain, and/or changes in bowel habits in many patients with IBS. FODMAPs are not unhealthy or harmful, but may exacerbate GI symptoms in those with sensitive GI tracts.   Thank you for entrusting me with your care and for choosing [facility], Dr. Ileene Patrick

## 2022-12-28 NOTE — Progress Notes (Signed)
HPI :  57 y/o female here for a follow up visit for chronic diarrhea. She was last seen in 07/2020. Recall she has a history of multiple sclerosis, chronic diarrhea, seizure disorder, fibromyalgia.  See prior office note for full details.  She has a history of chronic abdominal pain for years, had a hysterectomy for this but it did not resolve her symptoms.  She saw me in the past for chronic loose stools with frequency which led to workup with celiac serologies which were negative, colonoscopy in January 2022 with a few small polyps noted and hypertrophied anal papilla, biopsies of her colon showed no evidence of microscopic colitis.  Inflammatory markers have been negative.  She shows me blood work from May 10 from her PCP of this year, hemoglobin 13.0, white blood cell count 5.0.  I had given her an empiric trial of colestipol 1 g twice daily at her last encounter.  She had also been using Imodium as needed.  She states the Imodium really does not help her.  She does think the colestipol helps when she takes it, however she has a hard time spacing it out from her thyroid medication and in this light does not take it routinely.  She uses it more so after the fact that her diarrhea has already been bothering her.  She essentially does not really like the regimen is looking for something else.  She has frequent loose stools often at least 5 times per day, most of this is usually in the morning but can occur throughout the rest of the day.  Recall in the past she has had urgency, episodes of incontinence due to urgency in the past.  She states she does need to fast food or high fat or greasy foods.  She does not think dairy bothers her.  She has a lot of gas and bloating that can bother her as well as cramping as well on the time of her loose stools.  She is not using any NSAIDs.  Recall she has tried some Bentyl in the past which caused itching, she has tried Levsin in the past which made her stomach pains  worse.  I previously gave her sample of IBgard but she cannot recall if she ever took it stomach pains.  She had a CT scan done after her colonoscopy in January 2022 which was normal, done for chronic diarrhea and abdominal pains.  She is interested in pursuing a different regimen.  She states she has not been eating as much as she has in the past to minimize her stooling.  She thinks she is lost 30 pounds over the past few years due to the symptoms.  She relates this to decreasing the amount of food she eats.    Colonoscopy 2014 - reportedly 2 pre-cancerous polyps removed, no report available - Dr. Loreta Ave  Colonoscopy 07/24/20: - The perianal and digital rectal examinations were normal. - The terminal ileum appeared normal. - Two sessile polyps were found in the transverse colon. The polyps were 3 mm in size. These polyps were removed with a cold biopsy forceps. Resection and retrieval were complete. - A 8 mm polyp was found in the rectum. The polyp was sessile. The polyp was removed with a cold snare. Resection and retrieval were complete. - Anal papilla(e) were hypertrophied. Biopsies were taken with a cold forceps for histology to rule out AIN. - Internal hemorrhoids were found during retroflexion. - The exam was otherwise without abnormality. - Biopsies  for histology were taken with a cold forceps from the right colon, left colon and transverse colon for evaluation of microscopic colitis.  1. Surgical [P], colon, random sites - COLONIC MUCOSA WITH NO SIGNIFICANT PATHOLOGIC FINDINGS. - NEGATIVE FOR ACTIVE INFLAMMATION AND OTHER ABNORMALITIES. 2. Surgical [P], colon, transverse, polyp (2) - TUBULAR ADENOMA (X2). - NEGATIVE FOR HIGH GRADE DYSPLASIA. 3. Surgical [P], colon, anal papilla bx - SQUAMOUS EPITHELIUM WITH PARAKERATOSIS. - NEGATIVE FOR DYSPLASIA. 4. Surgical [P], colon, rectum, polyp - TUBULAR ADENOMA. - NEGATIVE FOR HIGH GRADE DYSPLASIA.  CT abdomen pelvis 08/04/20: IMPRESSION: 1. No  acute abdominopelvic findings. 2. Aortic atherosclerosis.   Negative celiac testing  Past Medical History:  Diagnosis Date   Allergy    Anemia    Anxiety    Depression    Fibromyalgia    Headache(784.0)    History of colon polyps    Insomnia    Multiple sclerosis, relapsing-remitting (HCC) 2016   Dr. Tinnie Gens - [facility]   Seizures [facility])    Thyroid disease    Hashimoto's thyroiditis 10-2018     Past Surgical History:  Procedure Laterality Date   ABDOMINAL HYSTERECTOMY     COLONOSCOPY  2014   5 polyps 2 was precanerous. Dr Loreta Ave   KNEE ARTHROSCOPY Left    Family History  Problem Relation Age of Onset   Diabetes Mother    Stomach cancer Maternal Grandmother    Colon cancer Maternal Grandmother    Birth defects Maternal Uncle        thyroid    Social History   Tobacco Use   Smoking status: Former    Packs/day: .2    Types: Cigarettes    Quit date: 07/11/2013    Years since quitting: 9.4   Smokeless tobacco: Never  Vaping Use   Vaping Use: Every day  Substance Use Topics   Alcohol use: Yes    Comment: occassional   Drug use: No   Current Outpatient Medications  Medication Sig Dispense Refill   acetaminophen-codeine (TYLENOL #3) 300-30 MG tablet Take by mouth every 4 (four) hours as needed for moderate pain.     amphetamine-dextroamphetamine (ADDERALL) 10 MG tablet Taske once or twice daily as needed 60 tablet 0   baclofen (LIORESAL) 10 MG tablet Take 1 tablet (10 mg total) by mouth 3 (three) times daily as needed for muscle spasms. 270 each 3   carbamazepine (TEGRETOL) 200 MG tablet TAKE 1 TABLET BY MOUTH 3 TIMES DAILY. 270 tablet 1   clonazePAM (KLONOPIN) 1 MG tablet Take 1 mg by mouth 2 (two) times daily as needed.     cloNIDine (CATAPRES) 0.1 MG tablet Take 0.1-0.2 mg by mouth at bedtime.     hydrOXYzine (ATARAX) 25 MG tablet Take 25-50 mg by mouth every 6 (six) hours as needed.     loperamide (IMODIUM) 2 MG capsule Take by mouth as needed for diarrhea or  loose stools.     montelukast (SINGULAIR) 10 MG tablet Take 10 mg by mouth daily as needed.     naproxen (NAPROSYN) 500 MG tablet Take 1 tablet (500 mg total) by mouth 2 (two) times daily as needed. 30 tablet 0   Peppermint Oil (IBGARD) 90 MG CPCR Take as directed 16 capsule 0   primidone (MYSOLINE) 50 MG tablet One po bid 60 tablet 5   QUEtiapine (SEROQUEL) 100 MG tablet Take 100 mg by mouth at bedtime.     SYNTHROID 125 MCG tablet Take 125 mcg by mouth every  morning.     Vilazodone HCl (VIIBRYD) 40 MG TABS 40 mg at bedtime.      Eluxadoline (VIBERZI) 100 MG TABS Take 1 tablet (100 mg total) by mouth 2 (two) times daily. 32 tablet 0   No current facility-administered medications for this visit.   Allergies  Allergen Reactions   Butalbital-Apap-Caff-Cod Itching   Morphine And Codeine Itching   Pregabalin Itching   Carbamazepine Rash   Amitriptyline Other (See Comments)   Azithromycin Nausea Only   Bentyl [Dicyclomine]     itching   Hydrocodone-Acetaminophen     Very sweaty at night   Lamotrigine Hives and Itching   Methocarbamol Nausea And Vomiting   Topiramate Diarrhea   Aspirin Nausea Only   Divalproex Sodium Rash    Hair loss   Doxycycline Itching and Rash     Review of Systems: All systems reviewed and negative except where noted in HPI.   Patient showed results from recent blood work from her PCP, as per HPI  Physical Exam: BP 110/80   Pulse 62   Ht 5\' 4"  (1.626 m)   Wt 114 lb 3.2 oz (51.8 kg)   BMI 19.60 kg/m  Constitutional: Pleasant,well-developed, female in no acute distress. Skin: Skin is warm and dry. No rashes noted. Psychiatric: Normal mood and affect. Behavior is normal.   ASSESSMENT: 57 y.o. female here for assessment of the following  1. Irritable bowel syndrome with diarrhea   2. Chronic diarrhea   3. Abdominal cramps    History as outlined above.  Celiac testing negative, colonoscopy unremarkable for any IBD or microscopic colitis.   Colestipol had helped somewhat however she has a hard time taking it reliably due to interaction with her thyroid etc. and having hard time managing this.  Her symptoms have essentially persisted over time loose stools with some urgency, abdominal cramping.  She is looking to change to different regimen.  Her most recent set of labs is normal.  Inflammatory markers normal, hemoglobin normal, WBC normal.  We reviewed some dietary measures, she does not really think she is eating anything to be causing this, does not think eating excessive dairy etc.  I provided her some handouts on a low FODMAP diet to see if there is anything there that could potentially be related.  Otherwise discussed options, I think Viberzi may be reasonable given the severity of her diarrhea and how much this bothers her.  I had a free sample in the office today, counseled her on potential risks/side effects, will give 100 mg twice daily for 2-week trial, if this helps her we will provide a prescription.  She has her gallbladder in place, denies any problems with her pancreas or pancreatitis, does not drink alcohol.  This should not interact with her thyroid.  I gave her IBgard samples as well, it does not appear she tried this at the last visit, will see if that helps as well.  I otherwise asked her to submit a stool sample for fecal pancreatic elastase to make sure normal.  Sounds like her weight loss has been driven by her decreased p.o. intake to manage the symptoms, CT scan within 2 years for similar symptoms was normal.  Hopefully she is able to increase her p.o. intake if her symptoms are improved, if not and that progresses, consider repeat imaging and EGD.  I asked her to contact me in a few weeks for an update on her symptoms and response to therapy.  PLAN: - stop colestid -  provided free samples of Viberzi 100mg  BID for 2 weeks, will give prescription if this work - provided free samples of IB gard to use PRN - low FODMAP  diet handout given - stool for fecal pancreatic elastase - contact me in a few weeks with an update  Harlin Rain, MD [facility]

## 2022-12-29 DIAGNOSIS — E039 Hypothyroidism, unspecified: Secondary | ICD-10-CM | POA: Diagnosis not present

## 2023-01-02 ENCOUNTER — Encounter: Payer: Self-pay | Admitting: Gastroenterology

## 2023-01-03 ENCOUNTER — Encounter: Payer: Self-pay | Admitting: Gastroenterology

## 2023-01-03 ENCOUNTER — Other Ambulatory Visit: Payer: 59

## 2023-01-03 DIAGNOSIS — K529 Noninfective gastroenteritis and colitis, unspecified: Secondary | ICD-10-CM | POA: Diagnosis not present

## 2023-01-03 DIAGNOSIS — R109 Unspecified abdominal pain: Secondary | ICD-10-CM | POA: Diagnosis not present

## 2023-01-03 DIAGNOSIS — K580 Irritable bowel syndrome with diarrhea: Secondary | ICD-10-CM | POA: Diagnosis not present

## 2023-01-04 ENCOUNTER — Other Ambulatory Visit: Payer: Self-pay | Admitting: Gastroenterology

## 2023-01-11 LAB — PANCREATIC ELASTASE, FECAL: Pancreatic Elastase-1, Stool: 187 mcg/g — ABNORMAL LOW

## 2023-01-16 ENCOUNTER — Encounter: Payer: Self-pay | Admitting: Gastroenterology

## 2023-01-16 ENCOUNTER — Other Ambulatory Visit: Payer: Self-pay

## 2023-01-16 DIAGNOSIS — R109 Unspecified abdominal pain: Secondary | ICD-10-CM

## 2023-01-16 DIAGNOSIS — K529 Noninfective gastroenteritis and colitis, unspecified: Secondary | ICD-10-CM

## 2023-01-16 DIAGNOSIS — K8689 Other specified diseases of pancreas: Secondary | ICD-10-CM

## 2023-01-16 MED ORDER — ZENPEP 40000-126000 UNITS PO CPEP: ORAL_CAPSULE | ORAL | 2 refills | Status: DC

## 2023-01-16 NOTE — Telephone Encounter (Signed)
MRCP order in epic. Secure staff message sent to radiology scheduling to contact patient to set up appt.

## 2023-01-19 ENCOUNTER — Other Ambulatory Visit: Payer: Self-pay

## 2023-01-19 ENCOUNTER — Other Ambulatory Visit: Payer: Self-pay | Admitting: Neurology

## 2023-01-24 ENCOUNTER — Encounter: Payer: Self-pay | Admitting: Gastroenterology

## 2023-01-27 ENCOUNTER — Ambulatory Visit (HOSPITAL_COMMUNITY): Payer: 59

## 2023-02-06 ENCOUNTER — Ambulatory Visit (HOSPITAL_COMMUNITY)
Admission: RE | Admit: 2023-02-06 | Discharge: 2023-02-06 | Disposition: A | Discharge status: Home / Self Care | Payer: 59 | Source: Ambulatory Visit | Attending: Gastroenterology | Admitting: Gastroenterology

## 2023-02-06 ENCOUNTER — Other Ambulatory Visit: Payer: Self-pay | Admitting: Gastroenterology

## 2023-02-06 DIAGNOSIS — K529 Noninfective gastroenteritis and colitis, unspecified: Secondary | ICD-10-CM | POA: Insufficient documentation

## 2023-02-06 DIAGNOSIS — K8689 Other specified diseases of pancreas: Secondary | ICD-10-CM | POA: Diagnosis not present

## 2023-02-06 DIAGNOSIS — R109 Unspecified abdominal pain: Secondary | ICD-10-CM | POA: Insufficient documentation

## 2023-02-06 DIAGNOSIS — K8020 Calculus of gallbladder without cholecystitis without obstruction: Secondary | ICD-10-CM | POA: Diagnosis not present

## 2023-02-06 MED ORDER — GADOBUTROL 1 MMOL/ML IV SOLN
5.0000 mL | Freq: Once | INTRAVENOUS | Status: AC | PRN
  Administered 2023-02-06: 5 mL via INTRAVENOUS

## 2023-02-07 ENCOUNTER — Encounter: Payer: Self-pay | Admitting: Gastroenterology

## 2023-02-10 ENCOUNTER — Other Ambulatory Visit: Payer: Self-pay | Admitting: Neurology

## 2023-02-12 ENCOUNTER — Encounter: Payer: Self-pay | Admitting: Gastroenterology

## 2023-02-13 ENCOUNTER — Encounter: Payer: Self-pay | Admitting: Neurology

## 2023-02-13 ENCOUNTER — Other Ambulatory Visit: Payer: Self-pay | Admitting: Neurology

## 2023-02-13 MED ORDER — AMPHETAMINE-DEXTROAMPHETAMINE 10 MG PO TABS: ORAL_TABLET | ORAL | 0 refills | Status: DC

## 2023-02-13 NOTE — Telephone Encounter (Addendum)
Last seen on 09/22/22 Follow up scheduled on 04/04/23 Last filled 12/23/22 #60 tablets (30 day supply) Rx pending to be signed   I called pharmacy they stated pt last Rx was picked up on 12/23/22

## 2023-02-13 NOTE — Telephone Encounter (Signed)
Pt last seen on 09/22/22 Follow up scheduled on 04/04/23 Last filled on 12/23/22 #60 tablet (30 day supply) Rx pending to be signed

## 2023-02-21 DIAGNOSIS — H02831 Dermatochalasis of right upper eyelid: Secondary | ICD-10-CM | POA: Diagnosis not present

## 2023-02-21 DIAGNOSIS — H02834 Dermatochalasis of left upper eyelid: Secondary | ICD-10-CM | POA: Diagnosis not present

## 2023-02-21 DIAGNOSIS — H524 Presbyopia: Secondary | ICD-10-CM | POA: Diagnosis not present

## 2023-02-21 DIAGNOSIS — H25813 Combined forms of age-related cataract, bilateral: Secondary | ICD-10-CM | POA: Diagnosis not present

## 2023-02-21 DIAGNOSIS — H04123 Dry eye syndrome of bilateral lacrimal glands: Secondary | ICD-10-CM | POA: Diagnosis not present

## 2023-02-23 ENCOUNTER — Encounter: Payer: Self-pay | Admitting: Gastroenterology

## 2023-02-23 NOTE — Telephone Encounter (Signed)
April, please see note below. Thanks

## 2023-03-25 ENCOUNTER — Other Ambulatory Visit: Payer: Self-pay | Admitting: Gastroenterology

## 2023-04-04 ENCOUNTER — Encounter: Payer: Self-pay | Admitting: Neurology

## 2023-04-04 ENCOUNTER — Ambulatory Visit: Payer: 59 | Admitting: Neurology

## 2023-04-04 VITALS — BP 138/86 | HR 79 | Ht 64.0 in | Wt 117.5 lb

## 2023-04-04 DIAGNOSIS — R9082 White matter disease, unspecified: Secondary | ICD-10-CM | POA: Diagnosis not present

## 2023-04-04 DIAGNOSIS — G4700 Insomnia, unspecified: Secondary | ICD-10-CM

## 2023-04-04 DIAGNOSIS — G250 Essential tremor: Secondary | ICD-10-CM

## 2023-04-04 DIAGNOSIS — R202 Paresthesia of skin: Secondary | ICD-10-CM | POA: Diagnosis not present

## 2023-04-04 DIAGNOSIS — G40909 Epilepsy, unspecified, not intractable, without status epilepticus: Secondary | ICD-10-CM

## 2023-04-04 DIAGNOSIS — R5383 Other fatigue: Secondary | ICD-10-CM

## 2023-04-04 DIAGNOSIS — F419 Anxiety disorder, unspecified: Secondary | ICD-10-CM

## 2023-04-04 DIAGNOSIS — F32A Depression, unspecified: Secondary | ICD-10-CM

## 2023-04-04 MED ORDER — ARIPIPRAZOLE 5 MG PO TABS: 5.0000 mg | ORAL_TABLET | Freq: Every day | ORAL | 5 refills | Status: DC

## 2023-04-04 MED ORDER — PREDNISONE 10 MG (21) PO TBPK: ORAL_TABLET | Freq: Every day | ORAL | 0 refills | Status: DC

## 2023-04-04 MED ORDER — AMPHETAMINE-DEXTROAMPHETAMINE 10 MG PO TABS: ORAL_TABLET | ORAL | 0 refills | Status: DC

## 2023-04-04 NOTE — Progress Notes (Signed)
[facility]  PATIENT: Courtney Green DOB: 07/22/1965  REFERRING DOCTOR OR PCP: Irena Reichmann, DO (PCP); [facility] neurology SOURCE: Patient, notes from Drs.  Estella Husk, and Tinnie Gens, imaging and laboratory reports, MRI images personally reviewed.  _________________________________   HISTORICAL  CHIEF COMPLAINT:  Chief Complaint  Patient presents with   Follow-up    Rm 11. Alone. She reports a lot of recent stress. She c/o bilateral leg tingling and cramps in calves. She also has sharp pain in spine. She denies any new seizure activity.    HISTORY OF PRESENT ILLNESS:  Courtney Green is a 57 y.o. woman with a diagnosis of MS.  Update 04/04/2023: She is noting much more anxiety.   Her dog passed away in Nov 14, 2022 after a long illness.   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  He had a recent heart attack.  Since then he is much more ill cognitively and physically.Marland Kitchen    She was trying to get in better health with exercise and was swimming but with all these issues, this is more difficult.  She used to see psychiatry.  She was placed on Seroquel and Viibryd and is also on clonidine at night.   She has had crying spells.   She feels she is at her breaking point.     She denies any additional seizures.   had a possible seizure back in November 2023.   She was just decorating her christmas tree (not on a ladder) going back and forth between the couch and tree    Next thing she knew she wok up on the floor and was very groggy.   She is on carbamazepine 200 mg po bid.   She had a diagnosis of multiple sclerosis and was on Ocrevus when I first saw her .  The changes in the brain are nonspecific and mild, actually fairly normal for age.  There were no lesions in the infratentorial brain or spinal cord.  Therefore, I wanted additional information.  Since the last visit, she had a lumbar puncture.   CSF showed elevated IgG index.    She had paired bands in the CSF and serum (4) but no bands just in the CSF (OCB seen in MS).  This the LP was inconsistent with MS.     She reports numbness/tigling sensations and dysesthesias in her arms and from knees down.    She also has leg cramps.   She takes baclofen but it makes her sleepy so just bid.  We tried lamotrigine but she developed a rash.   She is on Tegretol 200 mg po tid with only mild benefit.     She notes mild difficulties with gait and balance at times.  She reports the left leg giving out.    Vision is usually fine.  She has fatigue.  She is also on Adderall for fatigue has helped the fatigue and attention.     She has depression and is on Viibryd and duloxetine.     She has insomnia not responding well to her multiple medications   She initially did well on Viibry and Seroquel the last few months, this is not helping as much.   She was on clonazepam for anxiety and was taking it at night for a while with benefit but due to anxiety took daytime instead for a while.   Planning on going back to qHS. She has trouble quieting her  mind at night.   She has a legal issue with her neighbor and also her mother has a mild dementia and worsening.     She has had seizures since May 2012. Last definite one was in 2022 and maybe one Novemebr 2023 (only one on Tegretol)    With the events, she gets an aura x 3-4 seconds of a sensation coming over her.   If tries to get to a chair if that happens.    She loses consciousness.  He worse one was the first one where she had LOC x 5 minutes and bit her tongue.    She has essential tremor treated with tremor.     She has back and knee issues and sees Dr. Elmore Guise at [facility].   Lumbar surgery is being considered.   She has a h/o of Hashimoto's.   Two relatives have had thyroid cancer.    She is on levothyroxine.       History of MS diagnosis: She was diagnosed with MS in 2020 but reports having an episode of work in 2017 with weakness.    That  day she had to do a lt of walking and was feeling weaker when she got to er desk.  She then had a  bee-sting sensation in her legs that went up to her arms as the day continued.  Then, she felt very weak and slumped over her desk.   Over the next couple weeks, symptoms continued.    She had an MRI that showed some white matter foci.   She had no foci in her spinal cord.  She first saw Dr. Hyacinth Meeker who felt that she likely did have MS and then had a telemedicine visit with Dr. Purcell Nails in [place] who felt that she did not have MS.  Later, she was referred to Dr. Leotis Shames who diagnosed MS.  In the past, since the 1990's, she has had some of the sensory bee-sting dysesthesia sensations.  MRI looked more like chronic microvascular ischemic change so I checked the LP and CSF was inconsistent with MS.    History of head trauma/seizure: She had a couple head injuries including a fall off a horse many years ago with a mild concussion.  She had a fall in 2012 with her first GTC seizure.  There was loss of consciousness for about 7-10.    Vascular risks: She does not have hypertension or diabetes.  She has been a smoker.  Other risks for MRI changes: She had several episodes of head trauma with probable postconcussive symptoms many years ago when she fell off a horse and again in 2012 when she fell after her first seizure.  Imaging: MRI of the brain 06/15/2021 shows some scattered T2/FLAIR hyperintense foci, mostly in the subcortical white matter.  These are nonspecific.  MRI of the cervical spine 01/22/2020 showed a normal spinal cord.  There is mild spinal stenosis at C4-C5, C5-C6 and C6-C7.  At C6-C7, there is moderately severe right foraminal narrowing that could affect the right C7 nerve root.  Some foraminal narrowing but no definite nerve root compression  REVIEW OF SYSTEMS: Constitutional: No fevers, chills, sweats, or change in appetite.  She has Fatigue Eyes: No visual changes, double vision, eye  pain Ear, nose and throat: No hearing loss, ear pain, nasal congestion, sore throat Cardiovascular: No chest pain, palpitations Respiratory:  No shortness of breath at rest or with exertion.   No wheezes GastrointestinaI: No nausea, vomiting, diarrhea, abdominal  pain, fecal incontinence Genitourinary:  No dysuria, urinary retention or frequency.  No nocturia. Musculoskeletal: Neck pain.  Muscle cramps, pain in the legs left greater than right Integumentary: No rash, pruritus, skin lesions Neurological: as above Psychiatric: Reports depression and anxiety Endocrine: She has a history of Hashimoto's thyroiditis  hematologic/Lymphatic:  No anemia, purpura, petechiae. Allergic/Immunologic: No itchy/runny eyes, nasal congestion, recent allergic reactions, rashes  ALLERGIES: Allergies  Allergen Reactions   Butalbital-Apap-Caff-Cod Itching   Morphine And Codeine Itching   Pregabalin Itching   Carbamazepine Rash   Amitriptyline Other (See Comments)   Azithromycin Nausea Only   Bentyl [Dicyclomine]     itching   Hydrocodone-Acetaminophen     Very sweaty at night   Lamotrigine Hives and Itching   Methocarbamol Nausea And Vomiting   Topiramate Diarrhea   Aspirin Nausea Only   Divalproex Sodium Rash    Hair loss   Doxycycline Itching and Rash    HOME MEDICATIONS:  Current Outpatient Medications:    acetaminophen-codeine (TYLENOL #3) 300-30 MG tablet, Take by mouth every 4 (four) hours as needed for moderate pain., Disp: , Rfl:    amphetamine-dextroamphetamine (ADDERALL) 10 MG tablet, Taske once or twice daily as needed, Disp: 60 tablet, Rfl: 0   amphetamine-dextroamphetamine (ADDERALL) 10 MG tablet, Taske once or twice daily as needed, Disp: 60 tablet, Rfl: 0   baclofen (LIORESAL) 10 MG tablet, Take 1 tablet (10 mg total) by mouth 3 (three) times daily as needed for muscle spasms., Disp: 270 each, Rfl: 3   carbamazepine (TEGRETOL) 200 MG tablet, TAKE 1 TABLET BY MOUTH THREE TIMES A  DAY, Disp: 270 tablet, Rfl: 1   clonazePAM (KLONOPIN) 1 MG tablet, Take 1 mg by mouth 2 (two) times daily as needed., Disp: , Rfl:    cloNIDine (CATAPRES) 0.1 MG tablet, Take 0.1-0.2 mg by mouth at bedtime., Disp: , Rfl:    hydrOXYzine (ATARAX) 25 MG tablet, Take 25-50 mg by mouth every 6 (six) hours as needed., Disp: , Rfl:    loperamide (IMODIUM) 2 MG capsule, Take by mouth as needed for diarrhea or loose stools., Disp: , Rfl:    montelukast (SINGULAIR) 10 MG tablet, Take 10 mg by mouth daily as needed., Disp: , Rfl:    naproxen (NAPROSYN) 500 MG tablet, Take 1 tablet (500 mg total) by mouth 2 (two) times daily as needed., Disp: 30 tablet, Rfl: 0   Pancrelipase, Lip-Prot-Amyl, (ZENPEP) 40000-126000 units CPEP, Take 1 capsule with meals and 1 capsule with a snack, Disp: 150 capsule, Rfl: 2   primidone (MYSOLINE) 50 MG tablet, One po bid, Disp: 60 tablet, Rfl: 5   QUEtiapine (SEROQUEL) 100 MG tablet, Take 100 mg by mouth at bedtime., Disp: , Rfl:    SYNTHROID 125 MCG tablet, Take 125 mcg by mouth every morning., Disp: , Rfl:    Vilazodone HCl (VIIBRYD) 40 MG TABS, 40 mg at bedtime. , Disp: , Rfl:   PAST MEDICAL HISTORY: Past Medical History:  Diagnosis Date   Allergy    Anemia    Anxiety    Depression    Fibromyalgia    Headache(784.0)    History of colon polyps    Insomnia    Multiple sclerosis, relapsing-remitting (HCC) 2016   Dr. Tinnie Gens - [facility]   Seizures [facility])    Thyroid disease    Hashimoto's thyroiditis 10-2018    PAST SURGICAL HISTORY: Past Surgical History:  Procedure Laterality Date   ABDOMINAL HYSTERECTOMY     COLONOSCOPY  2014  5 polyps 2 was precanerous. Dr Loreta Ave   KNEE ARTHROSCOPY Left     FAMILY HISTORY: Family History  Problem Relation Age of Onset   Diabetes Mother    Stomach cancer Maternal Grandmother    Colon cancer Maternal Grandmother    Birth defects Maternal Uncle        thyroid     SOCIAL HISTORY:  Social History   Socioeconomic  History   Marital status: Divorced    Spouse name: Not on file   Number of children: 2   Years of education: Not on file   Highest education level: Associate degree: academic program  Occupational History   Not on file  Tobacco Use   Smoking status: Former    Current packs/day: 0.00    Types: Cigarettes    Quit date: 07/11/2013    Years since quitting: 9.7   Smokeless tobacco: Never  Vaping Use   Vaping status: Every Day  Substance and Sexual Activity   Alcohol use: Yes    Comment: occassional   Drug use: No   Sexual activity: Not Currently  Other Topics Concern   Not on file  Social History Narrative   Lives w mother and her companion   R handed   Caffeine: 2 C of half caff.    Social Determinants of Health   Financial Resource Strain: Not on file  Food Insecurity: Not on file  Transportation Needs: Not on file  Physical Activity: Not on file  Stress: Not on file  Social Connections: Not on file  Intimate Partner Violence: Unknown (10/13/2021)   [facility]   HITS    Physically Hurt: Not on file    Insult or Talk Down To: Not on file    Threaten Physical Harm: Not on file    Scream or Curse: Not on file     PHYSICAL EXAM  Vitals:   04/04/23 1502  BP: 138/86  Pulse: 79  Weight: 117 lb 8 oz (53.3 kg)  Height: 5\' 4"  (1.626 m)    Body mass index is 20.17 kg/m.   General: The patient is well-developed and well-nourished and in no acute distress  HEENT:  Head is [place]/AT.  Sclera are anicteric.    Skin: Extremities are without rash or edema.  Musculoskeletal: There is mild tenderness at the right occiput and mid to lower cervical paraspinal muscles.    Neurologic Exam  Mental status: The patient is alert and oriented x 3 at the time of the examination. The patient has apparent normal recent and remote memory, with an apparently normal attention span and concentration ability.   Speech is normal.  Cranial nerves: Extraocular  movements are full.  Facial strength and sensation was normal.  No obvious hearing deficits are noted.  Motor:  Muscle bulk is normal.   Tone is normal. Strength is  5 / 5 in all 4 extremities.   Sensory: She reports reduced sensation in the right arm and leg.  Coordination: Cerebellar testing reveals good finger-nose-finger and heel-to-shin bilaterally.  Gait and station: Station is normal.   Gait is mildly wide.  Tandem gait is wide. Romberg is negative.  Reflexes: Deep tendon reflexes are symmetric and normal bilaterally.         ASSESSMENT AND PLAN  Seizure disorder (HCC)  White matter abnormality on MRI of brain  Insomnia, unspecified type  Paresthesias  Benign essential tremor  Anxiety and depression  Other fatigue   MS is unlikely.  Changes in the brain are more likely to represent chronic microvascular ischemic change.  She is a former smoker and has hypertension as risks.  Remain off Ocrevus.    Increase Tegretol 200 mg po to tid for seizure and dysesthesias.   Also mood stabilizer Anxiety is much worse and a lot of stress issues.    Add Abilify and refer to [facility]  For tremor, continue clonazepam bid and mysoline  Return in 6 months or sooner if there are new or worsening neurologic symptoms.  40-minute office visit with the majority of the time spent face-to-face for history and physical, discussion/counseling and decision-making.  Additional time with record review and documentation.  This visit is part of a comprehensive longitudinal care medical relationship regarding the patients primary diagnosis of seizure disorder and related concerns.   Weber Monnier A. Epimenio Foot, MD, [facility] 04/04/2023, 3:32 PM Certified in Neurology, Clinical Neurophysiology, Sleep Medicine and Neuroimaging  [facility] [address] [place], [place] [address] [phone]

## 2023-04-05 ENCOUNTER — Encounter: Payer: Self-pay | Admitting: Neurology

## 2023-04-05 ENCOUNTER — Other Ambulatory Visit: Payer: Self-pay

## 2023-04-05 ENCOUNTER — Other Ambulatory Visit: Payer: Self-pay | Admitting: Neurology

## 2023-04-05 DIAGNOSIS — M79646 Pain in unspecified finger(s): Secondary | ICD-10-CM | POA: Diagnosis not present

## 2023-04-05 DIAGNOSIS — Z79899 Other long term (current) drug therapy: Secondary | ICD-10-CM | POA: Diagnosis not present

## 2023-04-05 DIAGNOSIS — R7301 Impaired fasting glucose: Secondary | ICD-10-CM | POA: Diagnosis not present

## 2023-04-05 DIAGNOSIS — E039 Hypothyroidism, unspecified: Secondary | ICD-10-CM | POA: Diagnosis not present

## 2023-04-05 MED ORDER — PRIMIDONE 50 MG PO TABS: ORAL_TABLET | ORAL | 5 refills | Status: DC

## 2023-04-07 ENCOUNTER — Ambulatory Visit: Payer: 59 | Admitting: Gastroenterology

## 2023-04-07 ENCOUNTER — Encounter: Payer: Self-pay | Admitting: Gastroenterology

## 2023-04-07 VITALS — BP 126/72 | HR 95 | Ht 64.0 in | Wt 122.0 lb

## 2023-04-07 DIAGNOSIS — K8689 Other specified diseases of pancreas: Secondary | ICD-10-CM | POA: Diagnosis not present

## 2023-04-07 DIAGNOSIS — K529 Noninfective gastroenteritis and colitis, unspecified: Secondary | ICD-10-CM

## 2023-04-07 DIAGNOSIS — K580 Irritable bowel syndrome with diarrhea: Secondary | ICD-10-CM

## 2023-04-07 MED ORDER — COLESTIPOL HCL 1 G PO TABS: 1.0000 g | ORAL_TABLET | Freq: Two times a day (BID) | ORAL | 3 refills | Status: AC | PRN

## 2023-04-07 MED ORDER — ZENPEP 40000-126000 UNITS PO CPEP: ORAL_CAPSULE | ORAL | 11 refills | Status: DC

## 2023-04-07 NOTE — Progress Notes (Signed)
HPI :  57 y/o female here for a follow up visit for chronic diarrhea. She was last seen in 07/2020. Recall she has a history of multiple sclerosis, chronic diarrhea, seizure disorder, fibromyalgia.   See prior office note for full details.  She has a history of chronic abdominal pain for years, had a hysterectomy for this but it did not resolve her symptoms.  She saw me in the past for chronic loose stools with frequency which led to workup with celiac serologies which were negative, colonoscopy in January 2022 with a few small polyps noted and hypertrophied anal papilla, biopsies of her colon showed no evidence of microscopic colitis.  Inflammatory markers have been negative.   I last saw her in June.  We had previously given her some colestipol twice daily.  She did not think that it helped to some extent but she had a hard time spacing it out from her thyroid medication.  We had discussed some other options after her last visit.  I gave her a trial of Viberzi which made her abdominal pain much worse and she did not tolerate it, so she stopped it.  We then had discussed some other options to treat her loose stools and pain to include TCA such as Elavil versus Cymbalta.  She states she had a seizure reaction to Elavil in the past and did not want to try it again.  She was concerned about Cymbalta interacting with her other medications so did not want to try that.  I have given her some IBgard which she states did not help.  Recall she has had itching with Bentyl in the past and Levsin appeared to make her pain worse as well.  We ordered a pancreatic fecal elastase test and the value was slightly low at 187.  Follow-up MRCP was done and her pancreas appears normal which is reassuring.  We gave her a trial of pancreatic enzymes and she states this has made her "1000%" better.  She is taking 1 tablet of Zenpep, 40,000 units with each meal and with snacks.  She feels this has definitely reduced her stool  frequency and helped reduce her pains.  She has usually 1 bowel movement per day that is formed, occasionally can have gas and cramping and loose stools if she eats some particular food triggers.  She does take Colestid as needed if she is having a bad day needs something else to help with her symptoms and she states this does help as well.  She usually takes Colestid about 2-3 times per month.  She needs refills of both of these.  Overall she is very happy with the regimen so far I think she is making good progress.  She endorses a lot of life stressors taking care of loved ones recently and this can lead to additional stress and problems with her stomach.  Her MRI does show gallstones which she is aware of.  She denies any postprandial abdominal pains that bother her on a routine basis.   Colonoscopy 2014 - reportedly 2 pre-cancerous polyps removed, no report available - Dr. Loreta Ave   Colonoscopy 07/24/20: - The perianal and digital rectal examinations were normal. - The terminal ileum appeared normal. - Two sessile polyps were found in the transverse colon. The polyps were 3 mm in size. These polyps were removed with a cold biopsy forceps. Resection and retrieval were complete. - A 8 mm polyp was found in the rectum. The polyp was sessile. The polyp  was removed with a cold snare. Resection and retrieval were complete. - Anal papilla(e) were hypertrophied. Biopsies were taken with a cold forceps for histology to rule out AIN. - Internal hemorrhoids were found during retroflexion. - The exam was otherwise without abnormality. - Biopsies for histology were taken with a cold forceps from the right colon, left colon and transverse colon for evaluation of microscopic colitis.   1. Surgical [P], colon, random sites - COLONIC MUCOSA WITH NO SIGNIFICANT PATHOLOGIC FINDINGS. - NEGATIVE FOR ACTIVE INFLAMMATION AND OTHER ABNORMALITIES. 2. Surgical [P], colon, transverse, polyp (2) - TUBULAR ADENOMA (X2). -  NEGATIVE FOR HIGH GRADE DYSPLASIA. 3. Surgical [P], colon, anal papilla bx - SQUAMOUS EPITHELIUM WITH PARAKERATOSIS. - NEGATIVE FOR DYSPLASIA. 4. Surgical [P], colon, rectum, polyp - TUBULAR ADENOMA. - NEGATIVE FOR HIGH GRADE DYSPLASIA.   CT abdomen pelvis 08/04/20: IMPRESSION: 1. No acute abdominopelvic findings. 2. Aortic atherosclerosis.   Negative celiac testing  Pancreatic elastase low at 187  MRCP 02/10/23: IMPRESSION: 1. Normal MRI appearance of the pancreas. No cystic or solid hyperenhancing pancreatic lesion identified. 2. Cholelithiasis without findings of acute cholecystitis.   Past Medical History:  Diagnosis Date   Allergy    Anemia    Anxiety    Depression    Fibromyalgia    Headache(784.0)    History of colon polyps    Insomnia    Multiple sclerosis, relapsing-remitting (HCC) 2016   Dr. Tinnie Gens - [facility]   Seizures [facility])    Thyroid disease    Hashimoto's thyroiditis 10-2018     Past Surgical History:  Procedure Laterality Date   ABDOMINAL HYSTERECTOMY     COLONOSCOPY  2014   5 polyps 2 was precanerous. Dr Loreta Ave   KNEE ARTHROSCOPY Left    Family History  Problem Relation Age of Onset   Diabetes Mother    Stomach cancer Maternal Grandmother    Colon cancer Maternal Grandmother    Birth defects Maternal Uncle        thyroid    Social History   Tobacco Use   Smoking status: Former    Current packs/day: 0.00    Types: Cigarettes    Quit date: 07/11/2013    Years since quitting: 9.7   Smokeless tobacco: Never  Vaping Use   Vaping status: Every Day  Substance Use Topics   Alcohol use: Yes    Comment: occassional   Drug use: No   Current Outpatient Medications  Medication Sig Dispense Refill   acetaminophen-codeine (TYLENOL #3) 300-30 MG tablet Take by mouth every 4 (four) hours as needed for moderate pain.     amphetamine-dextroamphetamine (ADDERALL) 10 MG tablet Taske once or twice daily as needed 60 tablet 0   ARIPiprazole (ABILIFY)  5 MG tablet Take 1 tablet (5 mg total) by mouth daily. 30 tablet 5   baclofen (LIORESAL) 10 MG tablet Take 1 tablet (10 mg total) by mouth 3 (three) times daily as needed for muscle spasms. 270 each 3   carbamazepine (TEGRETOL) 200 MG tablet TAKE 1 TABLET BY MOUTH THREE TIMES A DAY 270 tablet 1   clonazePAM (KLONOPIN) 1 MG tablet Take 1 mg by mouth 2 (two) times daily as needed.     cloNIDine (CATAPRES) 0.1 MG tablet Take 0.1-0.2 mg by mouth at bedtime.     hydrOXYzine (ATARAX) 25 MG tablet Take 25-50 mg by mouth every 6 (six) hours as needed.     loperamide (IMODIUM) 2 MG capsule Take by mouth as needed for diarrhea  or loose stools.     montelukast (SINGULAIR) 10 MG tablet Take 10 mg by mouth daily as needed.     naproxen (NAPROSYN) 500 MG tablet Take 1 tablet (500 mg total) by mouth 2 (two) times daily as needed. 30 tablet 0   Pancrelipase, Lip-Prot-Amyl, (ZENPEP) 40000-126000 units CPEP Take 1 capsule with meals and 1 capsule with a snack 150 capsule 2   predniSONE (STERAPRED UNI-PAK 21 TAB) 10 MG (21) TBPK tablet Take by mouth daily. Take steroid taper as written 21 tablet 0   primidone (MYSOLINE) 50 MG tablet One po bid 60 tablet 5   QUEtiapine (SEROQUEL) 100 MG tablet Take 100 mg by mouth at bedtime.     SYNTHROID 125 MCG tablet Take 125 mcg by mouth every morning.     triamcinolone cream (KENALOG) 0.1 % Apply 1 Application topically 2 (two) times daily as needed.     Vilazodone HCl (VIIBRYD) 40 MG TABS 40 mg at bedtime.      No current facility-administered medications for this visit.   Allergies  Allergen Reactions   Butalbital-Apap-Caff-Cod Itching   Morphine And Codeine Itching   Pregabalin Itching   Carbamazepine Rash   Amitriptyline Other (See Comments)   Azithromycin Nausea Only   Bentyl [Dicyclomine]     itching   Hydrocodone-Acetaminophen     Very sweaty at night   Lamotrigine Hives and Itching   Methocarbamol Nausea And Vomiting   Topiramate Diarrhea   Aspirin Nausea  Only   Divalproex Sodium Rash    Hair loss   Doxycycline Itching and Rash     Review of Systems: All systems reviewed and negative except where noted in HPI.    Physical Exam: BP 126/72   Pulse 95   Ht 5\' 4"  (1.626 m)   Wt 122 lb (55.3 kg)   BMI 20.94 kg/m  Constitutional: Pleasant,well-developed, female in no acute distress. Neurological: Alert and oriented to person place and time. Psychiatric: Normal mood and affect. Behavior is normal.   ASSESSMENT: 57 y.o. female here for assessment of the following  1. Chronic diarrhea   2. Pancreatic insufficiency   3. Irritable bowel syndrome with diarrhea    Chronic diarrhea with abdominal pains.  Workup as outlined above.  Suspect she likely has combination of IBS-D with some mild pancreatic insufficiency.  She has not responded well to traditional therapies for IBS as above, or had intolerance.  Fortunately she has responded very well to Zenpep and colestipol.  We discussed this for a bit.  She is very happy with the regimen and we will plan on continuing that for now.  She should take 1 dose with each meal and if she has a snack.  She can increase the dose to 2 tabs if she feels she needs it.  She can continue to take colestipol as needed.  She does have significant gas and stomach cramps at times depending on some food intake.  Gave her a handout about trying some Blima Singer over the counter to help supplement and digest some foods that are high in FODMAPs.  She is agreeable to try this.  Otherwise continue the current regimen, provided reassurance about the workup to date.  She is happy with the regimen so far  PLAN: - refill Zenpep - once with each meal and snack and titrate up as needed - refill colestid 1gm BID PRN - give handout about Blima Singer supplement - avoid trigger foods - f/u yearly if not if sooner with issues  Harlin Rain, MD [facility]

## 2023-04-07 NOTE — Patient Instructions (Signed)
We have sent the following medications to your pharmacy for you to pick up at your convenience: Colestid 1 g: Take twice a day as needed  ZenPep: Take 1 capsule with a meal and 1 with a snack. Titrate up as needed  We are giving you a handout today on [employer].  Thank you for entrusting me with your care and for choosing [facility], Dr. Ileene Patrick     If your blood pressure at your visit was 140/90 or greater, please contact your primary care physician to follow up on this. ______________________________________________________  If you are age 56 or older, your body mass index should be between 23-30. Your Body mass index is 20.94 kg/m. If this is out of the aforementioned range listed, please consider follow up with your Primary Care Provider.  If you are age 20 or younger, your body mass index should be between 19-25. Your Body mass index is 20.94 kg/m. If this is out of the aformentioned range listed, please consider follow up with your Primary Care Provider.  ________________________________________________________  The [facility] providers would like to encourage you to use [facility] to communicate with providers for non-urgent requests or questions.  Due to long hold times on the telephone, sending your provider a message by [facility] may be a faster and more efficient way to get a response.  Please allow 48 business hours for a response.  Please remember that this is for non-urgent requests.  _______________________________________________________  Due to recent changes in healthcare laws, you may see the results of your imaging and laboratory studies on MyChart before your provider has had a chance to review them.  We understand that in some cases there may be results that are confusing or concerning to you. Not all laboratory results come back in the same time frame and the provider may be waiting for multiple results in order to interpret others.  Please give [place] 48 hours  in order for your provider to thoroughly review all the results before contacting the office for clarification of your results.

## 2023-04-12 DIAGNOSIS — Z23 Encounter for immunization: Secondary | ICD-10-CM | POA: Diagnosis not present

## 2023-04-12 DIAGNOSIS — K529 Noninfective gastroenteritis and colitis, unspecified: Secondary | ICD-10-CM | POA: Diagnosis not present

## 2023-04-12 DIAGNOSIS — Z0000 Encounter for general adult medical examination without abnormal findings: Secondary | ICD-10-CM | POA: Diagnosis not present

## 2023-04-12 DIAGNOSIS — G4700 Insomnia, unspecified: Secondary | ICD-10-CM | POA: Diagnosis not present

## 2023-04-12 DIAGNOSIS — G43909 Migraine, unspecified, not intractable, without status migrainosus: Secondary | ICD-10-CM | POA: Diagnosis not present

## 2023-04-25 ENCOUNTER — Encounter: Payer: Self-pay | Admitting: Neurology

## 2023-04-27 ENCOUNTER — Other Ambulatory Visit: Payer: Self-pay | Admitting: Neurology

## 2023-04-27 NOTE — Telephone Encounter (Signed)
Last seen on 04/04/23 Follow up 10/05/23 Last filled on 04/04/23 #30 tablets (30 day supply)

## 2023-05-19 ENCOUNTER — Other Ambulatory Visit: Payer: Self-pay | Admitting: Neurology

## 2023-05-22 ENCOUNTER — Other Ambulatory Visit: Payer: Self-pay | Admitting: Neurology

## 2023-05-22 MED ORDER — BACLOFEN 10 MG PO TABS: 10.0000 mg | ORAL_TABLET | Freq: Three times a day (TID) | ORAL | 3 refills | Status: DC | PRN

## 2023-05-22 MED ORDER — AMPHETAMINE-DEXTROAMPHETAMINE 10 MG PO TABS: ORAL_TABLET | ORAL | 0 refills | Status: DC

## 2023-05-22 NOTE — Telephone Encounter (Addendum)
Last seen on 04/04/23 Follow up scheduled on 10/05/23 See my chart message in this encounter    I called patient regarding her my chart message to discuss the confusion. Patient reports she did pick up Rx for Adderall 10 mg tablet  and baclofen 10 mg tablets [facility] [address]   Patient asked I removal all pharmacy's and [facility]  [place] [facility] in chart. Patient informed this has been done.   Rx pending to be denied since Rx has been picked up already.

## 2023-05-22 NOTE — Telephone Encounter (Signed)
Last seen on 04/04/23 Follow up scheduled on 10/05/23 Last filled on 04/04/23 #270 tablets (90 day supply)  Rx denied requesting refill too soon.

## 2023-05-25 DIAGNOSIS — E039 Hypothyroidism, unspecified: Secondary | ICD-10-CM | POA: Diagnosis not present

## 2023-07-28 ENCOUNTER — Other Ambulatory Visit: Payer: Self-pay | Admitting: Neurology

## 2023-07-31 DIAGNOSIS — E039 Hypothyroidism, unspecified: Secondary | ICD-10-CM | POA: Diagnosis not present

## 2023-08-08 DIAGNOSIS — M542 Cervicalgia: Secondary | ICD-10-CM | POA: Diagnosis not present

## 2023-08-21 ENCOUNTER — Other Ambulatory Visit: Payer: Self-pay | Admitting: Neurology

## 2023-08-21 ENCOUNTER — Other Ambulatory Visit: Payer: Self-pay

## 2023-08-21 ENCOUNTER — Encounter: Payer: Self-pay | Admitting: Neurology

## 2023-08-21 MED ORDER — AMPHETAMINE-DEXTROAMPHETAMINE 10 MG PO TABS: ORAL_TABLET | ORAL | 0 refills | Status: DC

## 2023-08-21 MED ORDER — BACLOFEN 10 MG PO TABS: ORAL_TABLET | ORAL | 3 refills | Status: DC

## 2023-08-21 NOTE — Telephone Encounter (Signed)
 Pt last Seen 04/04/2023 Upcoming Appointment 10/05/2023  Adderall Last Filled 07/15/2023 Escript 08/21/2023

## 2023-08-22 ENCOUNTER — Encounter: Payer: Self-pay | Admitting: Gastroenterology

## 2023-08-30 DIAGNOSIS — M5412 Radiculopathy, cervical region: Secondary | ICD-10-CM | POA: Diagnosis not present

## 2023-09-06 DIAGNOSIS — M5412 Radiculopathy, cervical region: Secondary | ICD-10-CM | POA: Diagnosis not present

## 2023-09-20 DIAGNOSIS — M25512 Pain in left shoulder: Secondary | ICD-10-CM | POA: Diagnosis not present

## 2023-10-03 DIAGNOSIS — M5412 Radiculopathy, cervical region: Secondary | ICD-10-CM | POA: Diagnosis not present

## 2023-10-05 ENCOUNTER — Encounter: Payer: Self-pay | Admitting: Neurology

## 2023-10-05 ENCOUNTER — Ambulatory Visit: Payer: 59 | Admitting: Neurology

## 2023-10-05 VITALS — BP 122/78 | HR 61 | Ht 64.0 in | Wt 119.4 lb

## 2023-10-05 DIAGNOSIS — R202 Paresthesia of skin: Secondary | ICD-10-CM

## 2023-10-05 DIAGNOSIS — R5383 Other fatigue: Secondary | ICD-10-CM

## 2023-10-05 DIAGNOSIS — R9082 White matter disease, unspecified: Secondary | ICD-10-CM | POA: Diagnosis not present

## 2023-10-05 DIAGNOSIS — G40909 Epilepsy, unspecified, not intractable, without status epilepticus: Secondary | ICD-10-CM | POA: Diagnosis not present

## 2023-10-05 DIAGNOSIS — G4700 Insomnia, unspecified: Secondary | ICD-10-CM

## 2023-10-05 DIAGNOSIS — F419 Anxiety disorder, unspecified: Secondary | ICD-10-CM

## 2023-10-05 DIAGNOSIS — F32A Depression, unspecified: Secondary | ICD-10-CM

## 2023-10-05 DIAGNOSIS — G250 Essential tremor: Secondary | ICD-10-CM

## 2023-10-05 MED ORDER — PRIMIDONE 50 MG PO TABS: ORAL_TABLET | ORAL | 5 refills | Status: DC

## 2023-10-05 MED ORDER — CARBAMAZEPINE 200 MG PO TABS: 200.0000 mg | ORAL_TABLET | Freq: Three times a day (TID) | ORAL | 2 refills | Status: DC

## 2023-10-05 MED ORDER — AMPHETAMINE-DEXTROAMPHETAMINE 10 MG PO TABS: ORAL_TABLET | ORAL | 0 refills | Status: DC

## 2023-10-05 MED ORDER — BACLOFEN 10 MG PO TABS: ORAL_TABLET | ORAL | 3 refills | Status: AC

## 2023-10-05 NOTE — Progress Notes (Signed)
 [facility]  PATIENT: Courtney Green DOB: 03/14/1966  REFERRING DOCTOR OR PCP: Irena Reichmann, DO (PCP); [facility] neurology SOURCE: Patient, notes from Drs.  Estella Husk, and Tinnie Gens, imaging and laboratory reports, MRI images personally reviewed.  _________________________________   HISTORICAL  CHIEF COMPLAINT:  Chief Complaint  Patient presents with   Follow-up    Pt in room 11. Alone, Here for Seizure follow up. Pt reports no recent seizures. Pt has tingling in right hand and right leg for about 2 weeks now. Grip is off on right side, left side weakness.    HISTORY OF PRESENT ILLNESS:  Courtney Green is a 58 y.o. woman with a diagnosis of MS.  Update 10/05/2023: She denies any additional seizures.   She has had seizures since May 2012. Last definite one was in 2022 and maybe one Novemebr 2023 (only one on Tegretol)    With the events, she gets an aura x 3-4 seconds of a sensation coming over her.   If tries to get to a chair if that happens.    She loses consciousness.  He worse one was the first one where she had LOC x 5 minutes and bit her tongue.   She had a possible seizure back in November 2023.   She was just decorating her christmas tree (not on a ladder) going back and forth between the couch and tree    Next thing she knew she wok eup on the floor and was very groggy.   She is on carbamazepine 200 mg po bid.   She is noting more pain in her left shoulder.   She has a recent cervical ESI but it has not helped.   She will be seeing ortho again soon and saw them in past for an injection.   MRI cervical was recently done at [facility].   She had MRI in 2021 showing DJD C4-C7 with foraminal narrowing.  Including severe right C7 foraminal narrowin  She reports numbness/tigling sensations and dysesthesias in her arms and from knees down.    She also has leg cramps.   She takes baclofen but it makes her sleepy so just bid.  We tried lamotrigine  but she developed a rash.   She is on Tegretol 200 mg po tid with only mild benefit.     She notes mild difficulties with gait and balance at times.  She reports the left leg giving out.    Vision is usually fine.  She has a tremor and does better on primidone  She has anxiety and some depression.  She has fatigue.  She is also on Adderall for fatigue has helped the fatigue and attention.     She has depression and is on Viibryd and duloxetine.     She has insomnia not responding well to her multiple medications    She has trouble quieting her mind at night.   She has a legal issue with her neighbor and also her mother has a mild dementia and worsening.     She had a diagnosis of multiple sclerosis and was on Ocrevus when I first saw her .She was seeing Dr. Leotis Shames in the past.    The changes in the brain are nonspecific and mild, actually fairly normal for age.  There were no lesions in the infratentorial brain or spinal cord.  Therefore, I wanted additional information.  Since the last visit, she had a lumbar puncture.   CSF showed elevated IgG index.  She had paired bands in the CSF and serum (4) but no bands just in the CSF (OCB seen in MS).  This the LP was inconsistent with MS.     She has a h/o of Hashimoto's.   Two relatives have had thyroid cancer.    She is on levothyroxine.       History of MS diagnosis: She was diagnosed with MS in 2020 but reports having an episode of work in 2017 with weakness.    That day she had to do a lt of walking and was feeling weaker when she got to er desk.  She then had a  bee-sting sensation in her legs that went up to her arms as the day continued.  Then, she felt very weak and slumped over her desk.   Over the next couple weeks, symptoms continued.    She had an MRI that showed some white matter foci.   She had no foci in her spinal cord.  She first saw Dr. Hyacinth Meeker who felt that she likely did have MS and then had a telemedicine visit with Dr. Purcell Nails in  [place] who felt that she did not have MS.  Later, she was referred to Dr. Leotis Shames who diagnosed MS.  In the past, since the 1990's, she has had some of the sensory bee-sting dysesthesia sensations.  MRI looked more like chronic microvascular ischemic change so I checked the LP and CSF was inconsistent with MS.    History of head trauma/seizure: She had a couple head injuries including a fall off a horse many years ago with a mild concussion.  She had a fall in 2012 with her first GTC seizure.  There was loss of consciousness for about 7-10.    Vascular risks: She does not have hypertension or diabetes.  She has been a smoker.  Other risks for MRI changes: She had several episodes of head trauma with probable postconcussive symptoms many years ago when she fell off a horse and again in 2012 when she fell after her first seizure.  Imaging: MRI of the brain 06/15/2021 shows some scattered T2/FLAIR hyperintense foci, mostly in the subcortical white matter.  These are nonspecific.  MRI of the cervical spine 01/22/2020 showed a normal spinal cord.  There is mild spinal stenosis at C4-C5, C5-C6 and C6-C7.  At C6-C7, there is moderately severe right foraminal narrowing that could affect the right C7 nerve root.  Some foraminal narrowing but no definite nerve root compression  REVIEW OF SYSTEMS: Constitutional: No fevers, chills, sweats, or change in appetite.  She has Fatigue Eyes: No visual changes, double vision, eye pain Ear, nose and throat: No hearing loss, ear pain, nasal congestion, sore throat Cardiovascular: No chest pain, palpitations Respiratory:  No shortness of breath at rest or with exertion.   No wheezes GastrointestinaI: No nausea, vomiting, diarrhea, abdominal pain, fecal incontinence Genitourinary:  No dysuria, urinary retention or frequency.  No nocturia. Musculoskeletal: Neck pain.  Muscle cramps, pain in the legs left greater than right Integumentary: No rash, pruritus, skin  lesions Neurological: as above Psychiatric: Reports depression and anxiety Endocrine: She has a history of Hashimoto's thyroiditis  hematologic/Lymphatic:  No anemia, purpura, petechiae. Allergic/Immunologic: No itchy/runny eyes, nasal congestion, recent allergic reactions, rashes  ALLERGIES: Allergies  Allergen Reactions   Butalbital-Apap-Caff-Cod Itching   Morphine And Codeine Itching   Pregabalin Itching   Carbamazepine Rash   Amitriptyline Other (See Comments)   Azithromycin Nausea Only   Bentyl [Dicyclomine]  itching   Hydrocodone-Acetaminophen     Very sweaty at night   Lamotrigine Hives and Itching   Methocarbamol Nausea And Vomiting   Topiramate Diarrhea   Aspirin Nausea Only   Divalproex Sodium Rash    Hair loss   Doxycycline Itching and Rash    HOME MEDICATIONS:  Current Outpatient Medications:    clonazePAM (KLONOPIN) 1 MG tablet, Take 1 mg by mouth 2 (two) times daily as needed., Disp: , Rfl:    cloNIDine (CATAPRES) 0.1 MG tablet, Take 0.1-0.2 mg by mouth at bedtime., Disp: , Rfl:    colestipol (COLESTID) 1 g tablet, Take 1 tablet (1 g total) by mouth 2 (two) times daily as needed., Disp: 180 tablet, Rfl: 3   hydrOXYzine (ATARAX) 25 MG tablet, Take 25-50 mg by mouth every 6 (six) hours as needed., Disp: , Rfl:    loperamide (IMODIUM) 2 MG capsule, Take by mouth as needed for diarrhea or loose stools., Disp: , Rfl:    montelukast (SINGULAIR) 10 MG tablet, Take 10 mg by mouth daily as needed., Disp: , Rfl:    Pancrelipase, Lip-Prot-Amyl, (ZENPEP) 40000-126000 units CPEP, Take 1 capsule with meals and 1 capsule with a snack, titrate up as needed, Disp: 150 capsule, Rfl: 11   predniSONE (STERAPRED UNI-PAK 21 TAB) 10 MG (21) TBPK tablet, Take by mouth daily. Take steroid taper as written, Disp: 21 tablet, Rfl: 0   QUEtiapine (SEROQUEL) 100 MG tablet, Take 100 mg by mouth at bedtime., Disp: , Rfl:    SYNTHROID 125 MCG tablet, Take 125 mcg by mouth every morning.,  Disp: , Rfl:    triamcinolone cream (KENALOG) 0.1 %, Apply 1 Application topically 2 (two) times daily as needed., Disp: , Rfl:    Vilazodone HCl (VIIBRYD) 40 MG TABS, 40 mg at bedtime. , Disp: , Rfl:    acetaminophen-codeine (TYLENOL #3) 300-30 MG tablet, Take by mouth every 4 (four) hours as needed for moderate pain. (Patient not taking: Reported on 10/05/2023), Disp: , Rfl:    amphetamine-dextroamphetamine (ADDERALL) 10 MG tablet, Taske once or twice daily as needed, Disp: 60 tablet, Rfl: 0   baclofen (LIORESAL) 10 MG tablet, 10-20 mg po bid, Disp: 360 each, Rfl: 3   carbamazepine (TEGRETOL) 200 MG tablet, Take 1 tablet (200 mg total) by mouth 3 (three) times daily., Disp: 270 tablet, Rfl: 2   primidone (MYSOLINE) 50 MG tablet, One po bid, Disp: 60 tablet, Rfl: 5  PAST MEDICAL HISTORY: Past Medical History:  Diagnosis Date   Allergy    Anemia    Anxiety    Depression    Fibromyalgia    Headache(784.0)    History of colon polyps    Insomnia    Multiple sclerosis, relapsing-remitting (HCC) 2016   Dr. Tinnie Gens - [facility]   Seizures [facility])    Thyroid disease    Hashimoto's thyroiditis 10-2018    PAST SURGICAL HISTORY: Past Surgical History:  Procedure Laterality Date   ABDOMINAL HYSTERECTOMY     COLONOSCOPY  2014   5 polyps 2 was precanerous. Dr Loreta Ave   KNEE ARTHROSCOPY Left     FAMILY HISTORY: Family History  Problem Relation Age of Onset   Diabetes Mother    Stomach cancer Maternal Grandmother    Colon cancer Maternal Grandmother    Birth defects Maternal Uncle        thyroid     SOCIAL HISTORY:  Social History   Socioeconomic History   Marital status: Divorced    Spouse  name: Not on file   Number of children: 2   Years of education: Not on file   Highest education level: Associate degree: academic program  Occupational History   Not on file  Tobacco Use   Smoking status: Former    Current packs/day: 0.00    Types: Cigarettes    Quit date: 07/11/2013     Years since quitting: 10.2   Smokeless tobacco: Never  Vaping Use   Vaping status: Every Day  Substance and Sexual Activity   Alcohol use: Yes    Comment: occassional   Drug use: No   Sexual activity: Not Currently  Other Topics Concern   Not on file  Social History Narrative   Lives w mother and her companion   R handed   Caffeine: 2 C of half caff.    Social Drivers of Corporate investment banker Strain: Not on file  Food Insecurity: Not on file  Transportation Needs: Not on file  Physical Activity: Not on file  Stress: Not on file  Social Connections: Not on file  Intimate Partner Violence: Unknown (10/13/2021)   Received from [facility]   HITS    Physically Hurt: Not on file    Insult or Talk Down To: Not on file    Threaten Physical Harm: Not on file    Scream or Curse: Not on file     PHYSICAL EXAM  Vitals:   10/05/23 1412  BP: 122/78  Pulse: 61  Weight: 119 lb 6.4 oz (54.2 kg)  Height: 5\' 4"  (1.626 m)    Body mass index is 20.49 kg/m.   General: The patient is well-developed and well-nourished and in no acute distress  HEENT:  Head is [place]/AT.  Sclera are anicteric.    Skin: Extremities are without rash or edema.  Musculoskeletal: There is mild tenderness at the right occiput and mid to lower cervical paraspinal muscles.    Neurologic Exam  Mental status: The patient is alert and oriented x 3 at the time of the examination. The patient has apparent normal recent and remote memory, with an apparently normal attention span and concentration ability.   Speech is normal.  Cranial nerves: Extraocular movements are full.  Facial strength and sensation was normal.  No obvious hearing deficits are noted.  Motor:  Muscle bulk is normal.   Tone is normal. Strength is  5 / 5 in all 4 extremities.   Sensory: She reports reduced sensation in the right arm and leg.  Coordination: Cerebellar testing reveals good finger-nose-finger and  heel-to-shin bilaterally.  Gait and station: Station is normal.   Gait is mildly wide.  Tandem gait is wide. Romberg is negative.  Reflexes: Deep tendon reflexes are symmetric and normal bilaterally.         ASSESSMENT AND PLAN  Seizure disorder (HCC)  White matter abnormality on MRI of brain  Insomnia, unspecified type  Paresthesias  Benign essential tremor  Anxiety and depression  Other fatigue   Continue Tegretol 200 mg po to tid for seizure and dysesthesias.     MS is unlikely though another neurologist had diagnosed her with this.  .  Changes in the brain are more likely to represent chronic microvascular ischemic change.  She is a former smoker and has hypertension as risks.  Remain off DMTs.    For tremor, continue clonazepam bid and mysoline  Return in 6 months or sooner if there are new or worsening neurologic symptoms.  This visit is part of a comprehensive longitudinal care medical relationship regarding the patients primary diagnosis of seizure disorder and related concerns.   Beverlie Kurihara A. Epimenio Foot, MD, [facility] 10/05/2023, 2:56 PM Certified in Neurology, Clinical Neurophysiology, Sleep Medicine and Neuroimaging  [facility] [address] [place], [place] [address] [phone]

## 2023-10-18 DIAGNOSIS — M25512 Pain in left shoulder: Secondary | ICD-10-CM | POA: Diagnosis not present

## 2023-11-01 DIAGNOSIS — M19012 Primary osteoarthritis, left shoulder: Secondary | ICD-10-CM | POA: Diagnosis not present

## 2023-11-01 DIAGNOSIS — M7542 Impingement syndrome of left shoulder: Secondary | ICD-10-CM | POA: Diagnosis not present

## 2023-11-03 DIAGNOSIS — M25512 Pain in left shoulder: Secondary | ICD-10-CM | POA: Diagnosis not present

## 2023-11-13 DIAGNOSIS — E039 Hypothyroidism, unspecified: Secondary | ICD-10-CM | POA: Diagnosis not present

## 2023-12-10 ENCOUNTER — Encounter: Payer: Self-pay | Admitting: Neurology

## 2023-12-11 ENCOUNTER — Other Ambulatory Visit: Payer: Self-pay

## 2023-12-11 MED ORDER — AMPHETAMINE-DEXTROAMPHETAMINE 10 MG PO TABS: ORAL_TABLET | ORAL | 0 refills | Status: DC

## 2023-12-11 NOTE — Telephone Encounter (Signed)
 Pt Last Seen 10/02/2023 Upcoming Appointment 05/09/2024  Adderall 10/05/2023 Escript 12/11/2023

## 2023-12-21 ENCOUNTER — Encounter: Payer: Self-pay | Admitting: Neurology

## 2023-12-21 NOTE — Telephone Encounter (Signed)
 Copy of from placed in [facility] box.

## 2023-12-22 ENCOUNTER — Other Ambulatory Visit: Payer: Self-pay | Admitting: Gastroenterology

## 2023-12-22 DIAGNOSIS — G5603 Carpal tunnel syndrome, bilateral upper limbs: Secondary | ICD-10-CM | POA: Diagnosis not present

## 2023-12-28 ENCOUNTER — Encounter: Payer: Self-pay | Admitting: Neurology

## 2023-12-28 ENCOUNTER — Encounter: Payer: Self-pay | Admitting: Gastroenterology

## 2024-01-02 DIAGNOSIS — M25512 Pain in left shoulder: Secondary | ICD-10-CM | POA: Diagnosis not present

## 2024-01-02 DIAGNOSIS — M79641 Pain in right hand: Secondary | ICD-10-CM | POA: Diagnosis not present

## 2024-01-02 DIAGNOSIS — M79642 Pain in left hand: Secondary | ICD-10-CM | POA: Diagnosis not present

## 2024-01-03 ENCOUNTER — Ambulatory Visit: Admitting: Nurse Practitioner

## 2024-01-03 DIAGNOSIS — Z0100 Encounter for examination of eyes and vision without abnormal findings: Secondary | ICD-10-CM | POA: Diagnosis not present

## 2024-01-20 ENCOUNTER — Encounter: Payer: Self-pay | Admitting: Neurology

## 2024-01-22 MED ORDER — AMPHETAMINE-DEXTROAMPHETAMINE 10 MG PO TABS: ORAL_TABLET | ORAL | 0 refills | Status: DC

## 2024-01-22 NOTE — Telephone Encounter (Signed)
 Last seen on 10/05/23 Follow up scheduled on 05/09/24  DEXTROAMP-AMPHETAMIN 10 MG TAB 12/12/2023 30 60 each Sater, Charlie LABOR, MD [facility] [phone] - R...    Rx pending to be signed

## 2024-01-24 ENCOUNTER — Other Ambulatory Visit: Payer: Self-pay | Admitting: *Deleted

## 2024-01-24 MED ORDER — GABAPENTIN 300 MG PO CAPS
300.0000 mg | ORAL_CAPSULE | Freq: Four times a day (QID) | ORAL | 11 refills | Status: AC
Start: 2024-01-24 — End: ?

## 2024-02-12 DIAGNOSIS — G5601 Carpal tunnel syndrome, right upper limb: Secondary | ICD-10-CM | POA: Diagnosis not present

## 2024-02-21 DIAGNOSIS — G5601 Carpal tunnel syndrome, right upper limb: Secondary | ICD-10-CM | POA: Diagnosis not present

## 2024-02-27 ENCOUNTER — Ambulatory Visit: Admitting: Gastroenterology

## 2024-03-04 DIAGNOSIS — G5601 Carpal tunnel syndrome, right upper limb: Secondary | ICD-10-CM | POA: Diagnosis not present

## 2024-03-08 DIAGNOSIS — H9201 Otalgia, right ear: Secondary | ICD-10-CM | POA: Diagnosis not present

## 2024-03-08 DIAGNOSIS — M792 Neuralgia and neuritis, unspecified: Secondary | ICD-10-CM | POA: Diagnosis not present

## 2024-03-14 ENCOUNTER — Encounter (HOSPITAL_COMMUNITY): Payer: Self-pay | Admitting: Family Medicine

## 2024-03-15 ENCOUNTER — Other Ambulatory Visit (HOSPITAL_COMMUNITY): Payer: Self-pay | Admitting: Family Medicine

## 2024-03-15 DIAGNOSIS — R519 Headache, unspecified: Secondary | ICD-10-CM

## 2024-03-15 DIAGNOSIS — H9201 Otalgia, right ear: Secondary | ICD-10-CM

## 2024-03-17 ENCOUNTER — Encounter (HOSPITAL_COMMUNITY): Payer: Self-pay

## 2024-03-17 ENCOUNTER — Ambulatory Visit (HOSPITAL_COMMUNITY): Admission: RE | Admit: 2024-03-17 | Source: Ambulatory Visit

## 2024-03-30 ENCOUNTER — Other Ambulatory Visit: Payer: Self-pay | Admitting: Neurology

## 2024-04-01 NOTE — Telephone Encounter (Signed)
 Last seen on 10/05/23 Follow up scheduled 05/09/24

## 2024-04-02 DIAGNOSIS — M25512 Pain in left shoulder: Secondary | ICD-10-CM | POA: Diagnosis not present

## 2024-04-05 ENCOUNTER — Other Ambulatory Visit: Payer: Self-pay | Admitting: Gastroenterology

## 2024-04-11 DIAGNOSIS — R7301 Impaired fasting glucose: Secondary | ICD-10-CM | POA: Diagnosis not present

## 2024-04-11 DIAGNOSIS — E559 Vitamin D deficiency, unspecified: Secondary | ICD-10-CM | POA: Diagnosis not present

## 2024-04-11 DIAGNOSIS — E039 Hypothyroidism, unspecified: Secondary | ICD-10-CM | POA: Diagnosis not present

## 2024-04-11 DIAGNOSIS — Z79899 Other long term (current) drug therapy: Secondary | ICD-10-CM | POA: Diagnosis not present

## 2024-04-11 DIAGNOSIS — Z13220 Encounter for screening for lipoid disorders: Secondary | ICD-10-CM | POA: Diagnosis not present

## 2024-04-16 DIAGNOSIS — M5412 Radiculopathy, cervical region: Secondary | ICD-10-CM | POA: Diagnosis not present

## 2024-04-18 DIAGNOSIS — L659 Nonscarring hair loss, unspecified: Secondary | ICD-10-CM | POA: Diagnosis not present

## 2024-04-18 DIAGNOSIS — G4700 Insomnia, unspecified: Secondary | ICD-10-CM | POA: Diagnosis not present

## 2024-04-18 DIAGNOSIS — Z0000 Encounter for general adult medical examination without abnormal findings: Secondary | ICD-10-CM | POA: Diagnosis not present

## 2024-04-18 DIAGNOSIS — E039 Hypothyroidism, unspecified: Secondary | ICD-10-CM | POA: Diagnosis not present

## 2024-04-18 DIAGNOSIS — Z23 Encounter for immunization: Secondary | ICD-10-CM | POA: Diagnosis not present

## 2024-04-18 DIAGNOSIS — G35D Multiple sclerosis, unspecified: Secondary | ICD-10-CM | POA: Diagnosis not present

## 2024-04-26 ENCOUNTER — Other Ambulatory Visit (HOSPITAL_COMMUNITY): Payer: Self-pay | Admitting: Family Medicine

## 2024-04-26 DIAGNOSIS — Z1231 Encounter for screening mammogram for malignant neoplasm of breast: Secondary | ICD-10-CM

## 2024-04-28 ENCOUNTER — Other Ambulatory Visit: Payer: Self-pay | Admitting: Medical Genetics

## 2024-04-29 ENCOUNTER — Other Ambulatory Visit (HOSPITAL_COMMUNITY)
Admission: RE | Admit: 2024-04-29 | Discharge: 2024-04-29 | Disposition: A | Discharge status: Home / Self Care | Payer: Self-pay | Source: Ambulatory Visit | Attending: Oncology | Admitting: Oncology

## 2024-05-02 ENCOUNTER — Ambulatory Visit (HOSPITAL_COMMUNITY)

## 2024-05-02 ENCOUNTER — Ambulatory Visit (HOSPITAL_COMMUNITY)
Admission: RE | Admit: 2024-05-02 | Discharge: 2024-05-02 | Disposition: A | Discharge status: Home / Self Care | Source: Ambulatory Visit | Attending: Family Medicine | Admitting: Family Medicine

## 2024-05-02 ENCOUNTER — Encounter (HOSPITAL_COMMUNITY): Payer: Self-pay

## 2024-05-02 DIAGNOSIS — Z1231 Encounter for screening mammogram for malignant neoplasm of breast: Secondary | ICD-10-CM | POA: Insufficient documentation

## 2024-05-07 LAB — GENECONNECT MOLECULAR SCREEN: Genetic Analysis Overall Interpretation: NEGATIVE

## 2024-05-09 ENCOUNTER — Ambulatory Visit: Admitting: Neurology

## 2024-05-09 ENCOUNTER — Encounter: Payer: Self-pay | Admitting: Neurology

## 2024-05-09 VITALS — BP 105/72 | HR 74 | Ht 64.0 in | Wt 126.0 lb

## 2024-05-09 DIAGNOSIS — R202 Paresthesia of skin: Secondary | ICD-10-CM | POA: Diagnosis not present

## 2024-05-09 DIAGNOSIS — G40909 Epilepsy, unspecified, not intractable, without status epilepticus: Secondary | ICD-10-CM | POA: Diagnosis not present

## 2024-05-09 DIAGNOSIS — G250 Essential tremor: Secondary | ICD-10-CM

## 2024-05-09 DIAGNOSIS — F419 Anxiety disorder, unspecified: Secondary | ICD-10-CM

## 2024-05-09 DIAGNOSIS — R9082 White matter disease, unspecified: Secondary | ICD-10-CM | POA: Diagnosis not present

## 2024-05-09 DIAGNOSIS — R5383 Other fatigue: Secondary | ICD-10-CM | POA: Diagnosis not present

## 2024-05-09 DIAGNOSIS — F32A Depression, unspecified: Secondary | ICD-10-CM

## 2024-05-09 DIAGNOSIS — G4700 Insomnia, unspecified: Secondary | ICD-10-CM

## 2024-05-09 MED ORDER — PRIMIDONE 50 MG PO TABS: 50.0000 mg | ORAL_TABLET | Freq: Two times a day (BID) | ORAL | 3 refills | Status: AC

## 2024-05-09 MED ORDER — AMPHETAMINE-DEXTROAMPHETAMINE 10 MG PO TABS: ORAL_TABLET | ORAL | 0 refills | Status: DC

## 2024-05-09 NOTE — Progress Notes (Signed)
 [facility]  PATIENT: Courtney Green DOB: 1966/03/14  REFERRING DOCTOR OR PCP: Lonell Collet, DO (PCP); [facility] neurology SOURCE: Patient, notes from Drs.  Cleotilde Mayhew, and Reyes, imaging and laboratory reports, MRI images personally reviewed.  _________________________________   HISTORICAL  CHIEF COMPLAINT:  Chief Complaint  Patient presents with   RM 10    Seizures; reports doing well; is having some leg and shoulder pain; denies recent seizures; reports significant headaches since last seizure; alone    HISTORY OF PRESENT ILLNESS:  Courtney Green is a 58 y.o. woman with a diagnosis of MS.  Update 05/09/2024: She remains seizure free since the last visit.    She has had seizures since May 2012. Last definite one was in 2022 and maybe one Novemebr 2023 (only one on Tegretol )    With the events, she gets an aura x 3-4 seconds of a sensation coming over her.  She avoids flashing lights - lights bother her with astigmatism   If tries to get to a chair if that happens.    She loses consciousness.  He worse one was the first one where she had LOC x 5 minutes and bit her tongue.   She had a possible seizure back in November 2023.   She was back and forth with the Christmas tree and couch .      Next thing she knew she woke up on the floor and was very groggy.   She is on carbamazepine  200 mg po bid and tolerates it well  She has left shoulder.   She has a cervical ESI x 2 without much benefit (Dr. Bonner)   She had MRI in 2021 showing DJD C4-C7 with foraminal narrowing.  Including severe right C7 foraminal narrowin  She reports numbness/tigling sensations and dysesthesias in her arms and from knees down.   She has leg cramps helped by baclofen  but it makes her sleepy so just takes bid.   She tried to reduce the dose but feels more stiff/spastic so has gone back to 20 mg bid . Gabapentin  bid helps dysesthesia.   We tried lamotrigine  but she developed a  rash.   She is on Tegretol  200 mg po tid with only mild benefit for tingling.    She notes mild difficulties with gait and balance at times. Cannot tandem walk.   She reports the left leg giving out.    Vision is usually fine.    She has a tremor and does better on primidone .  She has had hair loss felt to be her Hashimoto's thyroiditis.  She is on Synthroid .     She has anxiety and some depression.  She has fatigue.  She is also on Adderall for fatigue has helped the fatigue and attention.    This helps her a lot She has depression and is on Viibryd  .    A little more stress with sister scheduled for cerebral aneurysm surgery.     She has insomnia not responding well to her multiple medications    She has trouble quieting her mind at night.    H/o diagnosis of MS She had a diagnosis of multiple sclerosis and was on Ocrevus when I first saw her .She was seeing Dr. Juliane in the past.    The changes in the brain are nonspecific and mild, actually fairly normal for age.  There were no lesions in the infratentorial brain or spinal cord.  Therefore, I wanted additional information.  Since the last visit, she had a lumbar puncture.   CSF showed elevated IgG index.   She had paired bands in the CSF and serum (4) but no bands just in the CSF (OCB seen in MS).  This the LP was inconsistent with MS.     She has a h/o of Hashimoto's.   Two relatives have had thyroid  cancer.    She is on levothyroxine .       History of MS diagnosis: She was diagnosed with MS in 2020 but reports having an episode of work in 2017 with weakness.    That day she had to do a lt of walking and was feeling weaker when she got to er desk.  She then had a  bee-sting sensation in her legs that went up to her arms as the day continued.  Then, she felt very weak and slumped over her desk.   Over the next couple weeks, symptoms continued.    She had an MRI that showed some white matter foci.   She had no foci in her spinal cord.  She first  saw Dr. Cleotilde who felt that she likely did have MS and then had a telemedicine visit with Dr. Murl in [place] who felt that she did not have MS.  Later, she was referred to Dr. Juliane who diagnosed MS.  In the past, since the 1990's, she has had some of the sensory bee-sting dysesthesia sensations.  MRI looked more like chronic microvascular ischemic change so I checked the LP and CSF was inconsistent with MS.    History of head trauma/seizure: She had a couple head injuries including a fall off a horse many years ago with a mild concussion.  She had a fall in 2012 with her first GTC seizure.  There was loss of consciousness for about 7-10.    Vascular risks: She does not have hypertension or diabetes.  She has been a smoker.  Other risks for MRI changes: She had several episodes of head trauma with probable postconcussive symptoms many years ago when she fell off a horse and again in 2012 when she fell after her first seizure.  Imaging: MRI of the brain 06/15/2021 shows some scattered T2/FLAIR hyperintense foci, mostly in the subcortical white matter.  These are nonspecific.  MRI of the cervical spine 01/22/2020 showed a normal spinal cord.  There is mild spinal stenosis at C4-C5, C5-C6 and C6-C7.  At C6-C7, there is moderately severe right foraminal narrowing that could affect the right C7 nerve root.  Some foraminal narrowing but no definite nerve root compression  REVIEW OF SYSTEMS: Constitutional: No fevers, chills, sweats, or change in appetite.  She has Fatigue Eyes: No visual changes, double vision, eye pain Ear, nose and throat: No hearing loss, ear pain, nasal congestion, sore throat Cardiovascular: No chest pain, palpitations Respiratory:  No shortness of breath at rest or with exertion.   No wheezes GastrointestinaI: No nausea, vomiting, diarrhea, abdominal pain, fecal incontinence Genitourinary:  No dysuria, urinary retention or frequency.  No nocturia. Musculoskeletal: Neck  pain.  Muscle cramps, pain in the legs left greater than right Integumentary: No rash, pruritus, skin lesions Neurological: as above Psychiatric: Reports depression and anxiety Endocrine: She has a history of Hashimoto's thyroiditis  hematologic/Lymphatic:  No anemia, purpura, petechiae. Allergic/Immunologic: No itchy/runny eyes, nasal congestion, recent allergic reactions, rashes  ALLERGIES: Allergies  Allergen Reactions   Butalbital-Apap-Caff-Cod Itching   Morphine And Codeine Itching   Pregabalin Itching   Carbamazepine   Rash   Amitriptyline Other (See Comments)   Azithromycin Nausea Only   Bentyl [Dicyclomine]     itching   Hydrocodone -Acetaminophen      Very sweaty at night   Lamotrigine  Hives and Itching   Methocarbamol  Nausea And Vomiting   Topiramate Diarrhea   Valproic Acid     Other Reaction(s): Not available   Aspirin Nausea Only   Divalproex Sodium Rash    Hair loss   Doxycycline  Itching and Rash    HOME MEDICATIONS:  Current Outpatient Medications:    acetaminophen -codeine (TYLENOL  #3) 300-30 MG tablet, Take by mouth every 4 (four) hours as needed for moderate pain., Disp: , Rfl:    baclofen  (LIORESAL ) 10 MG tablet, 10-20 mg po bid, Disp: 360 each, Rfl: 3   carbamazepine  (TEGRETOL ) 200 MG tablet, Take 1 tablet (200 mg total) by mouth 3 (three) times daily., Disp: 270 tablet, Rfl: 2   clonazePAM (KLONOPIN) 1 MG tablet, Take 1 mg by mouth 2 (two) times daily as needed., Disp: , Rfl:    cloNIDine (CATAPRES) 0.1 MG tablet, Take 0.1-0.2 mg by mouth at bedtime., Disp: , Rfl:    colestipol  (COLESTID ) 1 g tablet, Take 1 tablet (1 g total) by mouth 2 (two) times daily as needed., Disp: 180 tablet, Rfl: 3   gabapentin  (NEURONTIN ) 300 MG capsule, Take 1 capsule (300 mg total) by mouth 4 (four) times daily., Disp: 120 capsule, Rfl: 11   hydrOXYzine (ATARAX) 25 MG tablet, Take 25-50 mg by mouth every 6 (six) hours as needed., Disp: , Rfl:    loperamide (IMODIUM) 2 MG capsule,  Take by mouth as needed for diarrhea or loose stools., Disp: , Rfl:    montelukast (SINGULAIR) 10 MG tablet, Take 10 mg by mouth daily as needed., Disp: , Rfl:    Pancrelipase , Lip-Prot-Amyl, (ZENPEP ) 40000-126000 units CPEP, TAKE 1 CAPSULE WITH MEALS AND 1 CAPSULE WITH A SNACK.  Please schedule a yearly follow up now for further refills. Thank you, Disp: 150 capsule, Rfl: 1   QUEtiapine (SEROQUEL) 100 MG tablet, Take 100 mg by mouth at bedtime., Disp: , Rfl:    SYNTHROID  125 MCG tablet, Take 125 mcg by mouth every morning., Disp: , Rfl:    triamcinolone  cream (KENALOG) 0.1 %, Apply 1 Application topically 2 (two) times daily as needed., Disp: , Rfl:    VEOZAH 45 MG TABS, Take 1 tablet by mouth daily., Disp: , Rfl:    Vilazodone HCl (VIIBRYD) 40 MG TABS, 40 mg at bedtime. , Disp: , Rfl:    amphetamine -dextroamphetamine  (ADDERALL) 10 MG tablet, Take once or twice daily as needed, Disp: 60 tablet, Rfl: 0   primidone  (MYSOLINE ) 50 MG tablet, Take 1 tablet (50 mg total) by mouth 2 (two) times daily., Disp: 180 tablet, Rfl: 3  PAST MEDICAL HISTORY: Past Medical History:  Diagnosis Date   Allergy    Anemia    Anxiety    Depression    Fibromyalgia    Headache(784.0)    History of colon polyps    Insomnia    Multiple sclerosis, relapsing-remitting 2016   Dr. Reyes - [facility]   Seizures [facility])    Thyroid  disease    Hashimoto's thyroiditis 10-2018    PAST SURGICAL HISTORY: Past Surgical History:  Procedure Laterality Date   ABDOMINAL HYSTERECTOMY     COLONOSCOPY  2014   5 polyps 2 was precanerous. Dr Kristie   KNEE ARTHROSCOPY Left     FAMILY HISTORY: Family History  Problem Relation Age  of Onset   Diabetes Mother    Birth defects Maternal Uncle        thyroid     Stomach cancer Maternal Grandmother    Colon cancer Maternal Grandmother    Breast cancer Cousin     SOCIAL HISTORY:  Social History   Socioeconomic History   Marital status: Divorced    Spouse name: Not on file    Number of children: 2   Years of education: Not on file   Highest education level: Associate degree: academic program  Occupational History   Not on file  Tobacco Use   Smoking status: Former    Current packs/day: 0.00    Types: Cigarettes    Quit date: 07/11/2013    Years since quitting: 10.8   Smokeless tobacco: Never  Vaping Use   Vaping status: Every Day  Substance and Sexual Activity   Alcohol use: Yes    Comment: occassional   Drug use: No   Sexual activity: Not Currently  Other Topics Concern   Not on file  Social History Narrative   Lives w mother and her companion   R handed   Caffeine: 2 C of half caff.    Social Drivers of Corporate Investment Banker Strain: Not on file  Food Insecurity: Not on file  Transportation Needs: Not on file  Physical Activity: Not on file  Stress: Not on file  Social Connections: Not on file  Intimate Partner Violence: Unknown (10/13/2021)   Received from [facility]   HITS    Physically Hurt: Not on file    Insult or Talk Down To: Not on file    Threaten Physical Harm: Not on file    Scream or Curse: Not on file     PHYSICAL EXAM  Vitals:   05/09/24 1302  BP: 105/72  Pulse: 74  Weight: 126 lb (57.2 kg)  Height: 5' 4 (1.626 m)    Body mass index is 21.63 kg/m.   General: The patient is well-developed and well-nourished and in no acute distress  HEENT:  Head is [place]/AT.  Sclera are anicteric.    Skin: Extremities are without rash or edema.  Musculoskeletal: There is mild tenderness at the right occiput and mid to lower cervical paraspinal muscles.    Neurologic Exam  Mental status: The patient is alert and oriented x 3 at the time of the examination. The patient has apparent normal recent and remote memory, with an apparently normal attention span and concentration ability.   Speech is normal.  Cranial nerves: Extraocular movements are full.  Facial strength and sensation was normal.  No obvious hearing  deficits are noted.  Motor:  Muscle bulk is normal.   Tone is normal. Strength is  5 / 5 in all 4 extremities.   Sensory: She reports reduced sensation in the right arm and leg.  Coordination: Cerebellar testing reveals good finger-nose-finger and heel-to-shin bilaterally.  Gait and station: Station is normal.   Gait is mildly wide.  Tandem gait is wide. Romberg is negative.  Reflexes: Deep tendon reflexes are symmetric and normal bilaterally.         ASSESSMENT AND PLAN  Seizure disorder (HCC)  White matter abnormality on MRI of brain  Insomnia, unspecified type  Paresthesias  Benign essential tremor  Anxiety and depression  Other fatigue   For the seizures and dysesthesias, she will continue Tegretol  200 mg po to tid     MS is unlikely though she was diagnosed  with this in the past by another doctor.   I believe the WM changes are due to  chronic microvascular ischemic change.  She is a former smoker and has hypertension as risk factors.  Remain off DMTs.    For tremor, continue mysoline  bid.  Also on clonazepam Continue gabapentin  for dysesthesias and baclofen  for spasticity..  She notes benefit from both of these medications though they may contribute to some somnolence asked her to try to reduce the dose if possible Return in 6 months or sooner if there are new or worsening neurologic symptoms.   This visit is part of a comprehensive longitudinal care medical relationship regarding the patients primary diagnosis of seizure disorder and related concerns.   Everett Ricciardelli A. Vear, MD, [facility] 05/09/2024, 1:42 PM Certified in Neurology, Clinical Neurophysiology, Sleep Medicine and Neuroimaging  [facility] [address] [place], [place] [address] [phone]

## 2024-06-23 ENCOUNTER — Encounter: Payer: Self-pay | Admitting: Neurology

## 2024-06-25 MED ORDER — AMPHETAMINE-DEXTROAMPHETAMINE 10 MG PO TABS: ORAL_TABLET | ORAL | 0 refills | Status: AC

## 2024-06-25 NOTE — Telephone Encounter (Signed)
 Requested Prescriptions   Pending Prescriptions Disp Refills   amphetamine -dextroamphetamine  (ADDERALL) 10 MG tablet 60 tablet 0    Sig: Take once or twice daily as needed   Last seen 05/09/24 Next appt 12/12/24 Dispenses   Dispensed Days Supply Quantity Provider Pharmacy  DEXTROAMP-AMPHETAMIN 10 MG TAB 05/09/2024 30 60 each Sater, Charlie LABOR, MD [facility] [phone] - R...  AMPHETAMINE /DEXTROAMPHETAMINE   10 MG TABS 01/22/2024 30 60 tablet Sater, Charlie LABOR, MD [facility] [phone] - R...  DEXTROAMP-AMPHETAMIN 10 MG TAB 12/12/2023 30 60 each Sater, Charlie LABOR, MD [facility] [phone] - R...  DEXTROAMP-AMPHETAMIN 10 MG TAB 10/05/2023 30 60 each Sater, Charlie LABOR, MD [facility] [phone] - R...  DEXTROAMP-AMPHETAMIN 10 MG TAB 08/21/2023 30 60 each Penumalli, Eduard SAUNDERS, MD [facility] [phone] - R...  DEXTROAMP-AMPHETAMIN 10 MG TAB 07/15/2023 30 60 each Sater, Charlie LABOR, MD [facility] [phone] - R.SABRASABRA

## 2024-07-15 ENCOUNTER — Ambulatory Visit: Admitting: Urology

## 2024-07-15 ENCOUNTER — Encounter: Payer: Self-pay | Admitting: Urology

## 2024-07-15 ENCOUNTER — Ambulatory Visit (INDEPENDENT_AMBULATORY_CARE_PROVIDER_SITE_OTHER): Admitting: Urology

## 2024-07-15 VITALS — BP 134/80 | HR 73

## 2024-07-15 DIAGNOSIS — R3915 Urgency of urination: Secondary | ICD-10-CM

## 2024-07-15 MED ORDER — ESTRADIOL 0.01 % VA CREA: TOPICAL_CREAM | VAGINAL | 11 refills | Status: AC

## 2024-07-15 NOTE — Progress Notes (Signed)
 "  07/15/2024 1:19 PM   Courtney Green [birth year] [id number]  Referring provider: Gerome Brunet, DO [address] [place],  [place] [address]  No chief complaint on file.   HPI:  New pt -   1) LUTS - she has a good stream, but developed frequency and nocturia. She has urgency. She nay have had a UTI - dysuria and urine odor. Abx for e coli. Started Veozah for hot flashes. Noted enuresis x 1. Offered ERT. She did not start. She had a Hx for bleeding. No cancer. No breast cancer. NG risk includes MS and seizures. No gross hematuria. Jul 2024 MRCP with benign GU.   07/15/2024: Today, seen for the above.   UA clear.    PMH: Past Medical History:  Diagnosis Date   Allergy    Anemia    Anxiety    Depression    Fibromyalgia    Headache(784.0)    History of colon polyps    Insomnia    Multiple sclerosis, relapsing-remitting 2016   Dr. Reyes - [facility]   Seizures [facility])    Thyroid  disease    Hashimoto's thyroiditis 10-2018    Surgical History: Past Surgical History:  Procedure Laterality Date   ABDOMINAL HYSTERECTOMY     COLONOSCOPY  2014   5 polyps 2 was precanerous. Dr Kristie   KNEE ARTHROSCOPY Left     Home Medications:  Allergies as of 07/15/2024       Reactions   Butalbital-apap-caff-cod Itching   Morphine And Codeine Itching   Pregabalin Itching   Carbamazepine  Rash   Amitriptyline Other (See Comments)   Azithromycin Nausea Only   Bentyl [dicyclomine]    itching   Hydrocodone -acetaminophen     Very sweaty at night   Lamotrigine  Hives, Itching   Methocarbamol  Nausea And Vomiting   Topiramate Diarrhea   Valproic Acid    Other Reaction(s): Not available   Aspirin Nausea Only   Divalproex Sodium Rash   Hair loss   Doxycycline  Itching, Rash        Medication List        Accurate as of July 15, 2024  1:19 PM. If you have any questions, ask your nurse or doctor.          acetaminophen -codeine 300-30 MG tablet Commonly  known as: TYLENOL  #3 Take by mouth every 4 (four) hours as needed for moderate pain.   amphetamine -dextroamphetamine  10 MG tablet Commonly known as: ADDERALL Take once or twice daily as needed   baclofen  10 MG tablet Commonly known as: LIORESAL  10-20 mg po bid   carbamazepine  200 MG tablet Commonly known as: TEGRETOL  Take 1 tablet (200 mg total) by mouth 3 (three) times daily.   clonazePAM 1 MG tablet Commonly known as: KLONOPIN Take 1 mg by mouth 2 (two) times daily as needed.   cloNIDine 0.1 MG tablet Commonly known as: CATAPRES Take 0.1-0.2 mg by mouth at bedtime.   colestipol  1 g tablet Commonly known as: COLESTID  Take 1 tablet (1 g total) by mouth 2 (two) times daily as needed.   gabapentin  300 MG capsule Commonly known as: NEURONTIN  Take 1 capsule (300 mg total) by mouth 4 (four) times daily.   hydrOXYzine 25 MG tablet Commonly known as: ATARAX Take 25-50 mg by mouth every 6 (six) hours as needed.   loperamide 2 MG capsule Commonly known as: IMODIUM Take by mouth as needed for diarrhea or loose stools.   montelukast 10 MG tablet Commonly known as: SINGULAIR  Take 10 mg by mouth daily as needed.   primidone  50 MG tablet Commonly known as: MYSOLINE  Take 1 tablet (50 mg total) by mouth 2 (two) times daily.   QUEtiapine 100 MG tablet Commonly known as: SEROQUEL Take 100 mg by mouth at bedtime.   Synthroid  125 MCG tablet Generic drug: levothyroxine  Take 125 mcg by mouth every morning.   triamcinolone  cream 0.1 % Commonly known as: KENALOG Apply 1 Application topically 2 (two) times daily as needed.   Veozah 45 MG Tabs Generic drug: Fezolinetant Take 1 tablet by mouth daily.   Viibryd 40 MG Tabs Generic drug: Vilazodone HCl 40 mg at bedtime.   Zenpep  40000-126000 units Cpep Generic drug: Pancrelipase  (Lip-Prot-Amyl) TAKE 1 CAPSULE WITH MEALS AND 1 CAPSULE WITH A SNACK.  Please schedule a yearly follow up now for further refills. Thank you         Allergies: Allergies[1]  Family History: Family History  Problem Relation Age of Onset   Diabetes Mother    Birth defects Maternal Uncle        thyroid     Stomach cancer Maternal Grandmother    Colon cancer Maternal Grandmother    Breast cancer Cousin     Social History:  reports that she quit smoking about 11 years ago. Her smoking use included cigarettes. She smoked an average of 0.2 packs per day. She has never used smokeless tobacco. She reports current alcohol use. She reports that she does not use drugs.   Physical Exam: There were no vitals taken for this visit.  Constitutional:  Alert and oriented, No acute distress. HEENT: [place] AT, moist mucus membranes.  Trachea midline, no masses. Cardiovascular: No clubbing, cyanosis, or edema. Respiratory: Normal respiratory effort, no increased work of breathing. GI: Abdomen is soft, nontender, nondistended, no abdominal masses GU: No CVA tenderness Skin: No rashes, bruises or suspicious lesions. Neurologic: Grossly intact, no focal deficits, moving all 4 extremities. Psychiatric: Normal mood and affect.  Laboratory Data: Lab Results  Component Value Date   WBC 5.0 06/09/2020   HGB 13.0 06/09/2020   HCT 37.7 06/09/2020   MCV 91.8 06/09/2020   PLT 315.0 06/09/2020    Lab Results  Component Value Date   CREATININE 0.69 08/06/2015    No results found for: PSA  No results found for: TESTOSTERONE  No results found for: HGBA1C  Urinalysis    Component Value Date/Time   COLORURINE YELLOW 08/06/2015 1201   APPEARANCEUR CLEAR 08/06/2015 1201   LABSPEC 1.008 08/06/2015 1201   PHURINE 7.5 08/06/2015 1201   GLUCOSEU NEGATIVE 08/06/2015 1201   HGBUR NEGATIVE 08/06/2015 1201   BILIRUBINUR NEGATIVE 08/06/2015 1201   KETONESUR NEGATIVE 08/06/2015 1201   PROTEINUR NEGATIVE 08/06/2015 1201   NITRITE NEGATIVE 08/06/2015 1201   LEUKOCYTESUR NEGATIVE 08/06/2015 1201    No results found for: LABMICR, WBCUA,  RBCUA, LABEPIT, MUCUS, BACTERIA  Pertinent Imaging: N/a  results found for this or any previous visit.   Assessment & Plan:    1. Urinary urgency (Primary) - Discussed GSM guideline and recommendation for topical estrogen unless she starts systemic hormones. I sent topical vaginal estrogen for now. She may need an OAB in the future. Discussed nature r/b/a to topical estrogen replacement, recent removal of FDA warnings, etc.    No follow-ups on file.  Donnice Brooks, MD  [facility]  [address] [place], [place] [address] [phone]      [1]  Allergies Allergen Reactions   Butalbital-Apap-Caff-Cod  Itching   Morphine And Codeine Itching   Pregabalin Itching   Carbamazepine  Rash   Amitriptyline Other (See Comments)   Azithromycin Nausea Only   Bentyl [Dicyclomine]     itching   Hydrocodone -Acetaminophen      Very sweaty at night   Lamotrigine  Hives and Itching   Methocarbamol  Nausea And Vomiting   Topiramate Diarrhea   Valproic Acid     Other Reaction(s): Not available   Aspirin Nausea Only   Divalproex Sodium Rash    Hair loss   Doxycycline  Itching and Rash   "

## 2024-07-16 LAB — URINALYSIS, ROUTINE W REFLEX MICROSCOPIC
Bilirubin, UA: NEGATIVE
Glucose, UA: NEGATIVE
Ketones, UA: NEGATIVE
Nitrite, UA: NEGATIVE
Protein,UA: NEGATIVE
RBC, UA: NEGATIVE
Specific Gravity, UA: 1.015 (ref 1.005–1.030)
Urobilinogen, Ur: 0.2 mg/dL (ref 0.2–1.0)
pH, UA: 6 (ref 5.0–7.5)

## 2024-07-16 LAB — MICROSCOPIC EXAMINATION: Bacteria, UA: NONE SEEN

## 2024-07-19 ENCOUNTER — Other Ambulatory Visit: Payer: Self-pay | Admitting: Neurology

## 2024-08-06 ENCOUNTER — Encounter: Payer: Self-pay | Admitting: Urology

## 2024-08-06 NOTE — Telephone Encounter (Signed)
 Please advise.

## 2024-09-11 ENCOUNTER — Ambulatory Visit: Admitting: Urology

## 2024-12-12 ENCOUNTER — Ambulatory Visit: Admitting: Neurology

## 2024-12-23 ENCOUNTER — Ambulatory Visit: Admitting: Urology
